# Patient Record
Sex: Female | Born: 1990 | Race: White | Hispanic: No | Smoking: Current every day smoker
Health system: Southern US, Community
[De-identification: ages and names within clinical notes are randomized; demographics above are authoritative.]

## PROBLEM LIST (undated history)

## (undated) DIAGNOSIS — N809 Endometriosis, unspecified: Secondary | ICD-10-CM

## (undated) DIAGNOSIS — N289 Disorder of kidney and ureter, unspecified: Secondary | ICD-10-CM

## (undated) DIAGNOSIS — N760 Acute vaginitis: Secondary | ICD-10-CM

## (undated) HISTORY — PX: ELBOW SURGERY: SHX618

## (undated) HISTORY — PX: BLADDER SURGERY: SHX569

## (undated) HISTORY — PX: BILATERAL SALPINGOOPHORECTOMY: SHX1223

## (undated) HISTORY — PX: TUMOR REMOVAL: SHX12

## (undated) HISTORY — PX: CHOLECYSTECTOMY: SHX55

## (undated) HISTORY — PX: ABDOMINAL HYSTERECTOMY: SHX81

---

## 2012-06-21 ENCOUNTER — Ambulatory Visit: Payer: Self-pay | Admitting: Obstetrics & Gynecology

## 2013-09-11 DIAGNOSIS — R102 Pelvic and perineal pain: Secondary | ICD-10-CM | POA: Insufficient documentation

## 2013-09-11 DIAGNOSIS — IMO0002 Reserved for concepts with insufficient information to code with codable children: Secondary | ICD-10-CM | POA: Insufficient documentation

## 2015-04-04 DIAGNOSIS — Y9389 Activity, other specified: Secondary | ICD-10-CM | POA: Insufficient documentation

## 2015-04-04 DIAGNOSIS — Y998 Other external cause status: Secondary | ICD-10-CM | POA: Insufficient documentation

## 2015-04-04 DIAGNOSIS — Z720 Tobacco use: Secondary | ICD-10-CM | POA: Insufficient documentation

## 2015-04-04 DIAGNOSIS — W2209XA Striking against other stationary object, initial encounter: Secondary | ICD-10-CM | POA: Insufficient documentation

## 2015-04-04 DIAGNOSIS — Y9289 Other specified places as the place of occurrence of the external cause: Secondary | ICD-10-CM | POA: Insufficient documentation

## 2015-04-04 DIAGNOSIS — S5002XA Contusion of left elbow, initial encounter: Secondary | ICD-10-CM | POA: Insufficient documentation

## 2015-04-04 NOTE — ED Notes (Signed)
Patient to ED for left elbow pain. States she had surgery last year and four days ago she bumped it on the wall a couple of times and also bumped it on the car door and the pain "has intensified so much that I can't even pick up my chihuahua or my cat, I can't carry a gallon of milk." Patient states pain starts at elbow and radiates upwards and downwards as well. States Aspirin, Aleve, ibuprofen, Ultram, Percocet and Tylenol are not helping.

## 2015-04-05 ENCOUNTER — Emergency Department: Payer: Self-pay

## 2015-04-05 ENCOUNTER — Emergency Department
Admission: EM | Admit: 2015-04-05 | Discharge: 2015-04-05 | Disposition: A | Discharge status: Home / Self Care | Payer: Self-pay | Attending: Emergency Medicine | Admitting: Emergency Medicine

## 2015-04-05 DIAGNOSIS — S5002XA Contusion of left elbow, initial encounter: Secondary | ICD-10-CM

## 2015-04-05 MED ORDER — TRAMADOL HCL 50 MG PO TABS
100.0000 mg | ORAL_TABLET | Freq: Once | ORAL | Status: AC
  Administered 2015-04-05: 100 mg via ORAL
  Filled 2015-04-05: qty 2

## 2015-04-05 MED ORDER — IBUPROFEN 600 MG PO TABS
600.0000 mg | ORAL_TABLET | Freq: Once | ORAL | Status: AC
  Administered 2015-04-05: 600 mg via ORAL
  Filled 2015-04-05: qty 1

## 2015-04-05 MED ORDER — TRAMADOL HCL 50 MG PO TABS: 50.0000 mg | ORAL_TABLET | Freq: Four times a day (QID) | ORAL | Status: DC | PRN

## 2015-04-05 MED ORDER — IBUPROFEN 600 MG PO TABS: 600.0000 mg | ORAL_TABLET | Freq: Three times a day (TID) | ORAL | Status: DC | PRN

## 2015-04-05 NOTE — ED Notes (Signed)
Pt requesting to have 4 rings removed from her hands d/t swelling of her fingers (2 rings on the first 2 fingers of each hand). With the electric ring-cutter, this RN removed the rings specified by the pt, placed them in a specimen cup and were given to the pt.

## 2015-04-05 NOTE — ED Provider Notes (Signed)
[facility] Emergency Department Ajay Strubel Note   ____________________________________________  Time seen: 6 exam I have reviewed the triage vital signs and the triage nursing note.  HISTORY  Chief Complaint Elbow Pain   Historian Patient and her mother  HPI Glenda Rodriguez is a 24 y.o. female who has a history of prior elbow fracture with repair, who bumped her elbow into a wall about 4 days ago and since then has had persistent pain which is not relieved by Tylenol and ibuprofen, heat or cold. Pain is located laterally on the olecranon bump. States at times the pain seems to radiate down to her left fourth and fifth finger and up into her arm.  There is no additional trauma.    History reviewed. No pertinent past medical history.  There are no active problems to display for this patient.   Past Surgical History  Procedure Laterality Date  . Elbow surgery    . Abdominal hysterectomy    . Cholecystectomy      Current Outpatient Rx  Name  Route  Sig  Dispense  Refill  . ibuprofen (ADVIL,MOTRIN) 600 MG tablet   Oral   Take 1 tablet (600 mg total) by mouth every 8 (eight) hours as needed.   20 tablet   0   . traMADol (ULTRAM) 50 MG tablet   Oral   Take 1 tablet (50 mg total) by mouth every 6 (six) hours as needed.   10 tablet   0     Allergies Morphine and related; Sulfa antibiotics; and Toradol  No family history on file.  Social History Social History  Substance Use Topics  . Smoking status: Current Every Day Smoker  . Smokeless tobacco: None  . Alcohol Use: No    Review of Systems  Constitutional: Negative for fever. Eyes: Negative for visual changes. ENT: Negative for sore throat. Cardiovascular: Negative for chest pain. Respiratory: Negative for shortness of breath. Gastrointestinal: Negative for abdominal pain, vomiting and diarrhea. Genitourinary: Negative for dysuria. Musculoskeletal: Negative for back pain. Skin:  Negative for rash. Neurological: Negative for headache. 10 point Review of Systems otherwise negative ____________________________________________   PHYSICAL EXAM:  VITAL SIGNS: ED Triage Vitals  Enc Vitals Group     BP 04/04/15 2347 130/80 mmHg     Pulse Rate 04/04/15 2347 97     Resp 04/04/15 2347 18     Temp 04/04/15 2347 98.2 F (36.8 C)     Temp Source 04/04/15 2347 Oral     SpO2 04/04/15 2347 96 %     Weight 04/04/15 2347 140 lb (63.504 kg)     Height 04/04/15 2347  (1.778 m)     Head Cir --      Peak Flow --      Pain Score 04/05/15 0000 10     Pain Loc --      Pain Edu? --      Excl. in GC? --      Constitutional: Alert and oriented. Well appearing and in no distress. Eyes: Conjunctivae are normal. PERRL. Normal extraocular movements. ENT   Head: Normocephalic and atraumatic.   Nose: No congestion/rhinnorhea.   Mouth/Throat: Mucous membranes are moist.   Neck: No stridor. Cardiovascular/Chest: Normal rate, regular rhythm.  No murmurs, rubs, or gallops. Respiratory: Normal respiratory effort without tachypnea nor retractions. Breath sounds are clear and equal bilaterally. No wheezes/rales/rhonchi. Gastrointestinal: Soft. No distention, no guarding, no rebound. Nontender   Genitourinary/rectal:Deferred Musculoskeletal:minimal swelling of the bursa at the  left elbow. No redness or skin rash. Some tenderness with passive range of motion. Neurovascularly intact. Neurologic:  Normal speech and language. No gross or focal neurologic deficits are appreciated. Skin:  Skin is warm, dry and intact. No rash noted. Psychiatric: Mood and affect are normal. Speech and behavior are normal. Patient exhibits appropriate insight and judgment.  ____________________________________________   EKG I, Governor Rooks, MD, the attending physician have personally viewed and interpreted all ECGs.  No EKG performed ____________________________________________  LABS  (pertinent positives/negatives)  none  ____________________________________________  RADIOLOGY All Xrays were viewed by me. Imaging interpreted by Radiologist.  Elbow complete:  IMPRESSION: Postoperative fixation of the olecranon. No acute displaced fractures identified in the left elbow. __________________________________________  PROCEDURES  Procedure(s) performed: None  Critical Care performed: None  ____________________________________________   ED COURSE / ASSESSMENT AND PLAN  CONSULTATIONS: None  Pertinent labs & imaging results that were available during my care of the patient were reviewed by me and considered in my medical decision making (see chart for details).  Patient awoke x-ray is without acute bony abnormality. I suspect her pain is accommodation of contusion to soft tissue, bone, and possibly ulnar nerve. I discussed with her I'm not going to prescribe Percocet for this, as she was asking for Percocet by name. Patient was instructed to continue taking anti-inflammatory ibuprofen, and I will give her prescription for a limited number of Ultram for additional pain control. She is referred to follow-up with orthopedics if not better after 1 week for consideration of additional imaging such as MRI or physical therapy.  Patient has rings that she's had trouble getting off due to high lateral hand swelling and is requesting [place] to cut the rings off. 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  Patient / Family / Caregiver informed of clinical course, medical decision-making process, and agree with plan.   I discussed return precautions, follow-up instructions, and discharged instructions with patient and/or family.  ___________________________________________   FINAL CLINICAL IMPRESSION(S) / ED DIAGNOSES   Final diagnoses:  Left elbow contusion, initial encounter       Governor Rooks, MD 04/05/15 [phone]

## 2015-04-05 NOTE — Discharge Instructions (Signed)
You were evaluated after bumping her elbow and your x-ray is reassuring. You're being treated with anti-inflammatory and pain medication for contusion/bruising.  Return to the emergency department for any worsening pain, numbness, weakness, skin rash or redness, or any other symptoms concerning to you.  We discussed follow-up with orthopedic surgeon, if you have persistent pain after one week.   Contusion A contusion is a deep bruise. Contusions happen when an injury causes bleeding under the skin. Signs of bruising include pain, puffiness (swelling), and discolored skin. The contusion may turn blue, purple, or yellow. HOME CARE   Put ice on the injured area.  Put ice in a plastic bag.  Place a towel between your skin and the bag.  Leave the ice on for 15-20 minutes, 03-04 times a day.  Only take medicine as told by your doctor.  Rest the injured area.  If possible, raise (elevate) the injured area to lessen puffiness. GET HELP RIGHT AWAY IF:   You have more bruising or puffiness.  You have pain that is getting worse.  Your puffiness or pain is not helped by medicine. MAKE SURE YOU:   Understand these instructions. Elbow Contusion  An elbow contusion is a deep bruise of the elbow. Contusions happen when an injury causes bleeding under the skin. Signs of bruising include pain, puffiness (swelling), and discolored skin. The contusion may turn blue, purple, or yellow. HOME CARE Put ice on the injured area. Put ice in a plastic bag. Place a towel between your skin and the bag. Leave the ice on for 15-20 minutes, 03-04 times a day. Only take medicines as told by your doctor. Rest your elbow until the pain and puffiness are better. Raise (elevate) your elbow to lessen puffiness. Put on an elastic wrap as told by your doctor. You can take it off for sleeping, showers, and baths. If your fingers get cold, blue, or lose feeling (numb), take the wrap off. Put the wrap back on more  loosely. Use your elbow only as told by your doctor. If you are asked to do elbow exercises, do them as told. Keep all doctor visits as told. GET HELP RIGHT AWAY IF: You have more redness, puffiness, or pain in your elbow. Your puffiness or pain is not helped by medicines. You have puffiness of the hand and fingers. You are not able to move your fingers or wrist. You start to lose feeling in your hand or fingers. Your fingers or hand become cold or blue. MAKE SURE YOU:  Understand these instructions. Will watch your condition. Will get help right away if you are not doing well or get worse. Document Released: 06/17/2011 Document Revised: 12/28/2011 Document Reviewed: 06/17/2011 [facility] Patient Information 2015 [place], [place]. This information is not intended to replace advice given to you by your health care provider. Make sure you discuss any questions you have with your health care provider.   Will watch your condition.  Will get help right away if you are not doing well or get worse. Document Released: 12/15/2007 Document Revised: 09/20/2011 Document Reviewed: 05/03/2011 [facility] Patient Information 2015 [place], [place]. This information is not intended to replace advice given to you by your health care provider. Make sure you discuss any questions you have with your health care provider.

## 2015-04-08 ENCOUNTER — Emergency Department
Admission: EM | Admit: 2015-04-08 | Discharge: 2015-04-08 | Disposition: A | Discharge status: Home / Self Care | Payer: Self-pay | Attending: Emergency Medicine | Admitting: Emergency Medicine

## 2015-04-08 ENCOUNTER — Encounter: Payer: Self-pay | Admitting: Emergency Medicine

## 2015-04-08 DIAGNOSIS — M2550 Pain in unspecified joint: Secondary | ICD-10-CM | POA: Insufficient documentation

## 2015-04-08 DIAGNOSIS — Z720 Tobacco use: Secondary | ICD-10-CM | POA: Insufficient documentation

## 2015-04-08 DIAGNOSIS — M25522 Pain in left elbow: Secondary | ICD-10-CM | POA: Insufficient documentation

## 2015-04-08 DIAGNOSIS — F329 Major depressive disorder, single episode, unspecified: Secondary | ICD-10-CM | POA: Insufficient documentation

## 2015-04-08 DIAGNOSIS — R339 Retention of urine, unspecified: Secondary | ICD-10-CM | POA: Insufficient documentation

## 2015-04-08 DIAGNOSIS — Z79899 Other long term (current) drug therapy: Secondary | ICD-10-CM | POA: Insufficient documentation

## 2015-04-08 DIAGNOSIS — F419 Anxiety disorder, unspecified: Secondary | ICD-10-CM | POA: Insufficient documentation

## 2015-04-08 LAB — GLUCOSE, CAPILLARY: GLUCOSE-CAPILLARY: 84 mg/dL (ref 65–99)

## 2015-04-08 LAB — SALICYLATE LEVEL

## 2015-04-08 LAB — URINALYSIS COMPLETE WITH MICROSCOPIC (ARMC ONLY)
BACTERIA UA: NONE SEEN
BILIRUBIN URINE: NEGATIVE
GLUCOSE, UA: NEGATIVE mg/dL
KETONES UR: NEGATIVE mg/dL
Leukocytes, UA: NEGATIVE
NITRITE: NEGATIVE
Protein, ur: NEGATIVE mg/dL
Specific Gravity, Urine: 1.012 (ref 1.005–1.030)
pH: 7 (ref 5.0–8.0)

## 2015-04-08 LAB — POCT PREGNANCY, URINE: PREG TEST UR: NEGATIVE

## 2015-04-08 LAB — URINE DRUG SCREEN, QUALITATIVE (ARMC ONLY)
Amphetamines, Ur Screen: NOT DETECTED
Barbiturates, Ur Screen: NOT DETECTED
Benzodiazepine, Ur Scrn: POSITIVE — AB
COCAINE METABOLITE, UR ~~LOC~~: NOT DETECTED
Cannabinoid 50 Ng, Ur ~~LOC~~: NOT DETECTED
MDMA (ECSTASY) UR SCREEN: NOT DETECTED
METHADONE SCREEN, URINE: NOT DETECTED
Opiate, Ur Screen: NOT DETECTED
Phencyclidine (PCP) Ur S: NOT DETECTED
Tricyclic, Ur Screen: NOT DETECTED

## 2015-04-08 LAB — CBC WITH DIFFERENTIAL/PLATELET
BASOS ABS: 0 10*3/uL (ref 0–0.1)
BASOS PCT: 0 %
Eosinophils Absolute: 0.2 10*3/uL (ref 0–0.7)
Eosinophils Relative: 3 %
HEMATOCRIT: 33.3 % — AB (ref 35.0–47.0)
HEMOGLOBIN: 11.2 g/dL — AB (ref 12.0–16.0)
Lymphocytes Relative: 15 %
Lymphs Abs: 1.3 10*3/uL (ref 1.0–3.6)
MCH: 27.7 pg (ref 26.0–34.0)
MCHC: 33.6 g/dL (ref 32.0–36.0)
MCV: 82.4 fL (ref 80.0–100.0)
Monocytes Absolute: 0.8 10*3/uL (ref 0.2–0.9)
Monocytes Relative: 10 %
NEUTROS ABS: 6.2 10*3/uL (ref 1.4–6.5)
NEUTROS PCT: 72 %
Platelets: 239 10*3/uL (ref 150–440)
RBC: 4.04 MIL/uL (ref 3.80–5.20)
RDW: 12.6 % (ref 11.5–14.5)
WBC: 8.5 10*3/uL (ref 3.6–11.0)

## 2015-04-08 LAB — BASIC METABOLIC PANEL
ANION GAP: 6 (ref 5–15)
BUN: 15 mg/dL (ref 6–20)
CHLORIDE: 98 mmol/L — AB (ref 101–111)
CO2: 28 mmol/L (ref 22–32)
Calcium: 8.7 mg/dL — ABNORMAL LOW (ref 8.9–10.3)
Creatinine, Ser: 0.59 mg/dL (ref 0.44–1.00)
GFR calc non Af Amer: >60 mL/min (ref >60)
Glucose, Bld: 90 mg/dL (ref 65–99)
Potassium: 4.1 mmol/L (ref 3.5–5.1)
Sodium: 132 mmol/L — ABNORMAL LOW (ref 135–145)

## 2015-04-08 LAB — ETHANOL: Alcohol, Ethyl (B): <5 mg/dL (ref <5)

## 2015-04-08 LAB — PREGNANCY, URINE: Preg Test, Ur: NEGATIVE

## 2015-04-08 LAB — ACETAMINOPHEN LEVEL

## 2015-04-08 LAB — CK: Total CK: 174 U/L (ref 38–234)

## 2015-04-08 MED ORDER — LORAZEPAM 1 MG PO TABS
1.0000 mg | ORAL_TABLET | Freq: Once | ORAL | Status: AC
  Administered 2015-04-08: 1 mg via ORAL
  Filled 2015-04-08: qty 1

## 2015-04-08 MED ORDER — CATHETER RED RUBBER COATED MISC: 1.0000 "application " | Freq: Two times a day (BID) | Status: DC

## 2015-04-08 NOTE — ED Notes (Signed)
Bladder scan results . Pt has hx of urinary retention. Reported to MD

## 2015-04-08 NOTE — ED Notes (Signed)
Pt to ed with c/o multiple complaints of pain over body,  Pt states pain in left elbow, bilat leg pain, back pain, bilat feet pain and also reports pain and difficulty with urination.

## 2015-04-08 NOTE — ED Notes (Signed)
Pt d/c'd with indwelling foley catheter converted to leg bag with follow up instructions.
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
## 2015-04-08 NOTE — Discharge Instructions (Signed)
You were seen in the ER today for urinary retention. This has been a problem for over a year, and you should follow up with urogynecology as previously directed by [facility]. If you are unable to see them soon, you should see a primary care doctor in the meantime. In the meantime, use catheters to drain your bladder intermittently as needed. Your blood tests and urine tests today did not show any other acute findings or complications.  Acute Urinary Retention Acute urinary retention is the temporary inability to urinate. This is an uncommon problem in women. It can be caused by:  Infection.  A side effect of a medicine.  A problem in a nearby organ that presses or squeezes on the bladder or the urethra (the tube that drains the bladder).  Psychological problems.   Surgery on your bladder, urethra, or pelvic organs that causes obstruction to the outflow of urine from your bladder. HOME CARE INSTRUCTIONS  If you are sent home with a Foley catheter and a drainage system, you will need to discuss the best course of action with your health care provider. While the catheter is in, maintain a good intake of fluids. Keep the drainage bag emptied and lower than your catheter. This is so that contaminated urine will not flow back into your bladder, which could lead to a urinary tract infection. There are two main types of drainage bags. One is a large bag that usually is used at night. It has a good capacity that will allow you to sleep through the night without having to empty it. The second type is called a leg bag. It has a smaller capacity so it needs to be emptied more frequently. However, the main advantage is that it can be attached by a leg strap and goes underneath your clothing, allowing you the freedom to move about or leave your home. Only take over-the-counter or prescription medicines for pain, discomfort, or fever as directed by your health care provider.  SEEK MEDICAL CARE IF:  You develop a  low-grade fever.  You experience spasms or leakage of urine with the spasms. SEEK IMMEDIATE MEDICAL CARE IF:  1. You develop chills or fever. 2. Your catheter stops draining urine. 3. Your catheter falls out. 4. You start to develop increased bleeding that does not respond to rest and increased fluid intake. MAKE SURE YOU:  Understand these instructions.  Will watch your condition.  Will get help right away if you are not doing well or get worse. Document Released: 06/27/2006 Document Revised: 04/18/2013 Document Reviewed: 12/07/2012 [facility] Patient Information 2015 [place], [place]. This information is not intended to replace advice given to you by your health care provider. Make sure you discuss any questions you have with your health care provider.  Clean Intermittent Catheterization Clean intermittent catheterization (CIC) refers to emptying urine from the bladder with a small, flexible tube (catheter). The bladder is an organ in the body that stores urine. Urine may need to be drained from your bladder with a catheter if:  There is an obstruction to the flow of urine out of the bladder or through the urinary tract.  The bladder muscles or nerves are not functioning properly to allow normal flow of urine out of the bladder. Emptying the bladder regularly will help prevent further permanent bladder or kidney damage. SUPPLIES FOR CIC You will need:  The specific type and size of catheter as directed by your caregiver.  Water-soluble, lubricating jelly (if the catheter is not pre-lubricated). Do  not use an oil-based lubricant.  A warm, soapy washcloth or pre-moistened wipes.  A container to collect the urine (if you are not using the toilet).  A container or bag to store the catheter. HOW TO PERFORM CIC Follow these steps for a clean technique: 5. Collect supplies and place them within your reach. 6. Wash your hands thoroughly with soap and water. 7. Get in a comfortable  position. Positions include:  Sitting forward-facing or backward-facing on a toilet, wheelchair, chair, or edge of bed. It may be helpful to sit forward-facing with a mirror on a stool positioned to help you view the opening of the urethra. Or sit backward-facing on a toilet with a mirror positioned between the toilet lid and toilet seat to help you view the opening of the urethra.  Standing beside the toilet with one foot on the toilet rim.  Lying down with your head raised on pillows. 8. Position the collection container between your legs (if you are not using the toilet). 9. Urinate (if you are able). 10. Place water-soluble, lubricating jelly on about 2 inches (5 cm) of the tip of the catheter (if the catheter is not pre-lubricated). 11. Set the catheter down on a clean, dry surface within reach. 12. Spread the labia folds open. 13. Wash the labia with the warm, soapy washcloth or pre-moistened wipes. Wash from front to back. 14. Hold the labia open with one hand. 15. Relax. 16. Insert the catheter gently into the urethra opening in an upward and backward direction until urine starts to flow, usually 2 to 3 inches (5 to 8 cm). 17. When urine starts to flow, insert the catheter about 1 inch (3 cm) more. 18. When the urine stops flowing, strain or gently push on the lower abdominal muscles to help the bladder drain fully. 19. Gently pull the catheter out. 20. Wash the labia and genital area. 21. Report any changes in the urine to your caregiver. 22. Discard the urine. 23. If you are using a multiple use catheter, wash the catheter as directed by your caregiver. Rinse. Allow to air dry. Store the catheter in a clean, dry container or bag. 24. Wash your hands. HOME CARE INSTRUCTIONS  Drink 6 to 8 glasses of fluid each day. Avoid caffeine. Caffeine may make you have to urinate more frequently and more urgently.  Empty your bladder every 4 to 6 hours or as directed by your  caregiver.  Perform a CIC if you have symptoms of too much urine in your bladder (overdistension), and you are not able to urinate. Symptoms of overdistension include:  Restlessness.  Sweating or chills.  Headache.  Flushed or pale color.  Cold limbs.  Bloated lower abdomen.  Discard a multiple use catheter when it becomes dry, brittle, or cloudy (usually after 1 week of use).  Take all medications as directed by your caregiver. SEEK MEDICAL CARE IF:  You are having trouble performing any of the steps.  You are leaking urine.  You have pain when you urinate.  You notice blood in your urine.  You feel the need to empty your bladder (void) often.  Your urine is cloudy or smells different.  You have pain in your abdomen.  You develop a rash or sores. SEEK IMMEDIATE MEDICAL CARE IF:  You have a fever or persistent symptoms for more than 72 hours.  You have a fever and your symptoms suddenly get worse.  Your pain becomes severe. Document Released: 07/31/2010 Document Revised: 11/12/2013 Document  Reviewed: 07/31/2010 [employer] Patient Information 2015 [place], [place]. This information is not intended to replace advice given to you by your health care provider. Make sure you discuss any questions you have with your health care provider.

## 2015-04-09 ENCOUNTER — Encounter: Payer: Self-pay | Admitting: Emergency Medicine

## 2015-04-09 ENCOUNTER — Emergency Department
Admission: EM | Admit: 2015-04-09 | Discharge: 2015-04-09 | Disposition: A | Discharge status: Home / Self Care | Payer: Self-pay | Attending: Emergency Medicine | Admitting: Emergency Medicine

## 2015-04-09 ENCOUNTER — Emergency Department: Payer: Self-pay

## 2015-04-09 DIAGNOSIS — G8929 Other chronic pain: Secondary | ICD-10-CM | POA: Insufficient documentation

## 2015-04-09 DIAGNOSIS — R1084 Generalized abdominal pain: Secondary | ICD-10-CM | POA: Insufficient documentation

## 2015-04-09 DIAGNOSIS — R1032 Left lower quadrant pain: Secondary | ICD-10-CM | POA: Insufficient documentation

## 2015-04-09 DIAGNOSIS — Z720 Tobacco use: Secondary | ICD-10-CM | POA: Insufficient documentation

## 2015-04-09 HISTORY — DX: Disorder of kidney and ureter, unspecified: N28.9

## 2015-04-09 LAB — URINALYSIS COMPLETE WITH MICROSCOPIC (ARMC ONLY)
BILIRUBIN URINE: NEGATIVE
Bacteria, UA: NONE SEEN
GLUCOSE, UA: NEGATIVE mg/dL
Ketones, ur: NEGATIVE mg/dL
Nitrite: NEGATIVE
Protein, ur: NEGATIVE mg/dL
SQUAMOUS EPITHELIAL / LPF: NONE SEEN
Specific Gravity, Urine: 1.035 — ABNORMAL HIGH (ref 1.005–1.030)
pH: 6 (ref 5.0–8.0)

## 2015-04-09 LAB — COMPREHENSIVE METABOLIC PANEL
ALK PHOS: 84 U/L (ref 38–126)
ALT: 22 U/L (ref 14–54)
AST: 27 U/L (ref 15–41)
Albumin: 3.6 g/dL (ref 3.5–5.0)
Anion gap: 5 (ref 5–15)
BUN: 16 mg/dL (ref 6–20)
CHLORIDE: 105 mmol/L (ref 101–111)
CO2: 30 mmol/L (ref 22–32)
CREATININE: 0.73 mg/dL (ref 0.44–1.00)
Calcium: 9 mg/dL (ref 8.9–10.3)
GFR calc Af Amer: >60 mL/min (ref >60)
Glucose, Bld: 80 mg/dL (ref 65–99)
Potassium: 4.2 mmol/L (ref 3.5–5.1)
Sodium: 140 mmol/L (ref 135–145)
Total Bilirubin: <0.1 mg/dL — ABNORMAL LOW (ref 0.3–1.2)
Total Protein: 6.6 g/dL (ref 6.5–8.1)

## 2015-04-09 LAB — CBC
HCT: 35.6 % (ref 35.0–47.0)
Hemoglobin: 11.9 g/dL — ABNORMAL LOW (ref 12.0–16.0)
MCH: 27.7 pg (ref 26.0–34.0)
MCHC: 33.4 g/dL (ref 32.0–36.0)
MCV: 83.1 fL (ref 80.0–100.0)
PLATELETS: 261 10*3/uL (ref 150–440)
RBC: 4.28 MIL/uL (ref 3.80–5.20)
RDW: 13 % (ref 11.5–14.5)
WBC: 7.8 10*3/uL (ref 3.6–11.0)

## 2015-04-09 LAB — LIPASE, BLOOD: Lipase: 19 U/L — ABNORMAL LOW (ref 22–51)

## 2015-04-09 LAB — CK: CK TOTAL: 129 U/L (ref 38–234)

## 2015-04-09 MED ORDER — ONDANSETRON HCL 4 MG/2ML IJ SOLN
4.0000 mg | Freq: Once | INTRAMUSCULAR | Status: AC
  Administered 2015-04-09: 4 mg via INTRAVENOUS
  Filled 2015-04-09: qty 2

## 2015-04-09 MED ORDER — IOHEXOL 240 MG/ML SOLN
25.0000 mL | Freq: Once | INTRAMUSCULAR | Status: AC | PRN
  Administered 2015-04-09: 25 mL via ORAL

## 2015-04-09 MED ORDER — OXYCODONE-ACETAMINOPHEN 5-325 MG PO TABS
1.0000 | ORAL_TABLET | Freq: Once | ORAL | Status: AC
  Administered 2015-04-09: 1 via ORAL
  Filled 2015-04-09: qty 1

## 2015-04-09 MED ORDER — FENTANYL CITRATE (PF) 100 MCG/2ML IJ SOLN
50.0000 ug | Freq: Once | INTRAMUSCULAR | Status: AC
  Administered 2015-04-09: 50 ug via INTRAVENOUS
  Filled 2015-04-09: qty 2

## 2015-04-09 MED ORDER — ONDANSETRON HCL 4 MG PO TABS: 4.0000 mg | ORAL_TABLET | Freq: Every day | ORAL | Status: DC | PRN

## 2015-04-09 MED ORDER — DICYCLOMINE HCL 20 MG PO TABS: 20.0000 mg | ORAL_TABLET | Freq: Three times a day (TID) | ORAL | Status: DC | PRN

## 2015-04-09 MED ORDER — OXYCODONE-ACETAMINOPHEN 5-325 MG PO TABS: 1.0000 | ORAL_TABLET | Freq: Four times a day (QID) | ORAL | Status: DC | PRN

## 2015-04-09 MED ORDER — SODIUM CHLORIDE 0.9 % IV BOLUS (SEPSIS)
1000.0000 mL | Freq: Once | INTRAVENOUS | Status: AC
  Administered 2015-04-09: 1000 mL via INTRAVENOUS

## 2015-04-09 MED ORDER — IOHEXOL 300 MG/ML  SOLN
100.0000 mL | Freq: Once | INTRAMUSCULAR | Status: AC | PRN
  Administered 2015-04-09: 100 mL via INTRAVENOUS

## 2015-04-09 NOTE — ED Notes (Signed)
Pt to ed with c/o abd pain,  Pt states she was seen here yesterday for inability to void.  Pt states she had catheter placed and this am she awoke and was unable to move.  Pt with blisters noted to left abd area.

## 2015-04-09 NOTE — ED Notes (Signed)
Patient transported to CT 

## 2015-04-09 NOTE — Discharge Instructions (Signed)

## 2015-04-09 NOTE — ED Provider Notes (Addendum)
[facility] Emergency Department Provider Note  ____________________________________________  Time seen: Approximately 945 AM  I have reviewed the triage vital signs and the nursing notes.   HISTORY  Chief Complaint Abdominal Pain    HPI Glenda Rodriguez is a 24 y.o. female with a history of an abdominal hysterectomy and cholecystectomy who is presenting today with diffuse abdominal pain which is worse in the right lower quadrant. She says that she is also had diffuse body aches. All the symptoms have been increasing over the last 3 days. She was seen in the emergency department here yesterday for urinary retention and required a Foley catheter. She has needed a Foley catheter in the past for urinary retention. She says her only outpatient medication at this time is tramadol. She is also complaining of a group of bolus to the left lower quadrant of her abdomen. She denies any recent antibiotics. Her last at about oh several months ago. The only medication she is taking at this time is been the tramadol. Also takes ibuprofen occasionally.The abdominal pain is sharp and diffuse. It is been constant. Says that she has a baseline vaginal discharge which is unchanged. Denies any bleeding. 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   Past Medical History  Diagnosis Date  . Renal disorder     There are no active problems to display for this patient.   Past Surgical History  Procedure Laterality Date  . Elbow surgery    . Abdominal hysterectomy    . Cholecystectomy      Current Outpatient Rx  Name  Route  Sig  Dispense  Refill  . ibuprofen (ADVIL,MOTRIN) 600 MG tablet   Oral   Take 1 tablet (600 mg total) by mouth every 8 (eight) hours as needed.   20 tablet   0   . traMADol (ULTRAM) 50 MG tablet   Oral   Take 1 tablet (50 mg total) by mouth every 6 (six) hours as needed.   10 tablet   0     Allergies Morphine and related; Sulfa antibiotics; and  Toradol  History reviewed. No pertinent family history.  Social History Social History  Substance Use Topics  . Smoking status: Current Every Day Smoker  . Smokeless tobacco: None  . Alcohol Use: No    Review of Systems Constitutional: No fever/chills Eyes: No visual changes. ENT: No sore throat. Cardiovascular: Denies chest pain. Respiratory: Denies shortness of breath. Gastrointestinal: As above  No nausea, no vomiting.  No diarrhea.  No constipation. Genitourinary: Negative for dysuria. Musculoskeletal: Negative for back pain. Skin: As above Neurological: Negative for headaches, focal weakness or numbness.  10-point ROS otherwise negative.  ____________________________________________   PHYSICAL EXAM:  VITAL SIGNS: ED Triage Vitals  Enc Vitals Group     BP 04/09/15 0927 115/71 mmHg     Pulse Rate 04/09/15 0927 87     Resp 04/09/15 0927 20     Temp 04/09/15 0927 97.9 F (36.6 C)     Temp Source 04/09/15 0927 Oral     SpO2 04/09/15 0927 96 %     Weight 04/09/15 0927 145 lb (65.772 kg)     Height 04/09/15 0927  (1.778 m)     Head Cir --      Peak Flow --      Pain Score 04/09/15 0927 9     Pain Loc --      Pain Edu? --      Excl.  in GC? --     Constitutional: Alert and oriented. in no acute distress. Eyes: Conjunctivae are normal. PERRL. EOMI. Head: Atraumatic. Nose: No congestion/rhinnorhea. Mouth/Throat: Mucous membranes are moist.  Oropharynx non-erythematous. Neck: No stridor.   Cardiovascular: Normal rate, regular rhythm. Grossly normal heart sounds.  Good peripheral circulation. Respiratory: Normal respiratory effort.  No retractions. Lungs CTAB. Gastrointestinal: Soft with tenderness diffusely with greater tenderness to the right lower quadrant. No rebound or guarding.. No distention. No abdominal bruits. No CVA tenderness. Musculoskeletal: No lower extremity tenderness nor edema.  No joint effusions. Neurologic:  Normal speech and language.  No gross focal neurologic deficits are appreciated. No gait instability. Skin:  Left lower quadrant with 3 cm x 6 cm area of several bullae. They're filled with serous fluid. There is mild tenderness over the area. There is a negative to occult ski sign. There are no intraoral lesions. There are no lesions to the palms or the soles.  Psychiatric: Mood and affect are normal. Speech and behavior are normal.  ____________________________________________   LABS (all labs ordered are listed, but only abnormal results are displayed)  Labs Reviewed  COMPREHENSIVE METABOLIC PANEL - Abnormal; Notable for the following:    Total Bilirubin <0.1 (*)    All other components within normal limits  CBC - Abnormal; Notable for the following:    Hemoglobin 11.9 (*)    All other components within normal limits  URINALYSIS COMPLETEWITH MICROSCOPIC ([facility] ONLY) - Abnormal; Notable for the following:    Color, Urine YELLOW (*)    APPearance CLEAR (*)    Specific Gravity, Urine 1.035 (*)    Hgb urine dipstick 1+ (*)    Leukocytes, UA TRACE (*)    All other components within normal limits  LIPASE, BLOOD - Abnormal; Notable for the following:    Lipase 19 (*)    All other components within normal limits  CK   ____________________________________________  EKG   ____________________________________________  RADIOLOGY  CAT scan of the abdomen without any acute findings. Foley catheter appears in good position. Post hysterectomy, segment backup location. Some cutaneous blisters in the left lower abdominal wall. ____________________________________________   PROCEDURES    ____________________________________________   INITIAL IMPRESSION / ASSESSMENT AND PLAN / ED COURSE  Pertinent labs & imaging results that were available during my care of the patient were reviewed by me and considered in my medical decision making (see chart for details). ----------------------------------------- 1:17 PM on  04/09/2015 -----------------------------------------  Patient resting comfortably at this time. Discussed lab as well as imaging findings with the patient as well as her mother. Discussed that there was no acute finding on the CAT scan that the labs are reassuring. The patient will follow-up with the mother's primary care doctor and I will give the patient the office number for urology on-call for office follow-up to remove her Foley. Patient has chronic issues with abdominal pain which was ultimately diagnosed as endometriosis which resulted in the removal of her uterus and ovaries. We'll discharge with Bentyl Zofran and several Percocet for breakthrough pain. We'll stop tramadol this may be the cause of her urinary retention. I checked the [place] is controlled substances reporting system which did not show any record of multiple narcotic prescriptions.  ____________________________________________   FINAL CLINICAL IMPRESSION(S) / ED DIAGNOSES  Acute on chronic abdominal pain. Return visit.    Myrna Blazer, MD 04/09/15 1319  Unclear cause of the bullae. However, did not have any mucosal involvement or lesions to the  palms or soles. Is not on any medication that is common for causing Stevens-Umbaugh's or toxic epidermal necrolysis. We discussed stopping tramadol for purposes of urinary retention, thus she will not be on any of her previous medications. She will do local wound care including an antibiotic ointment and keeping the lesions covered. Denied having any contact of anything abrasive to her skin in the left lower quadrant rather than where the blisters were found. No obvious medical contact for a contact dermatitis either. Discussed this with both the patient and her mother.  Myrna Blazer, MD 04/09/15 1400

## 2015-04-09 NOTE — ED Notes (Signed)
Report given to Derrick, RN.

## 2015-04-12 DIAGNOSIS — M549 Dorsalgia, unspecified: Secondary | ICD-10-CM | POA: Insufficient documentation

## 2015-04-12 DIAGNOSIS — F4310 Post-traumatic stress disorder, unspecified: Secondary | ICD-10-CM | POA: Insufficient documentation

## 2015-04-12 DIAGNOSIS — R339 Retention of urine, unspecified: Secondary | ICD-10-CM | POA: Insufficient documentation

## 2015-04-12 DIAGNOSIS — F419 Anxiety disorder, unspecified: Secondary | ICD-10-CM

## 2015-04-12 DIAGNOSIS — F32A Depression, unspecified: Secondary | ICD-10-CM | POA: Insufficient documentation

## 2015-04-12 DIAGNOSIS — F329 Major depressive disorder, single episode, unspecified: Secondary | ICD-10-CM | POA: Insufficient documentation

## 2015-04-12 DIAGNOSIS — G4730 Sleep apnea, unspecified: Secondary | ICD-10-CM | POA: Insufficient documentation

## 2015-04-12 DIAGNOSIS — T7491XA Unspecified adult maltreatment, confirmed, initial encounter: Secondary | ICD-10-CM | POA: Insufficient documentation

## 2015-04-12 DIAGNOSIS — R5383 Other fatigue: Secondary | ICD-10-CM | POA: Insufficient documentation

## 2015-04-13 DIAGNOSIS — R443 Hallucinations, unspecified: Secondary | ICD-10-CM | POA: Insufficient documentation

## 2015-04-14 DIAGNOSIS — F17200 Nicotine dependence, unspecified, uncomplicated: Secondary | ICD-10-CM | POA: Insufficient documentation

## 2015-04-24 ENCOUNTER — Encounter: Payer: Self-pay | Admitting: *Deleted

## 2015-04-24 ENCOUNTER — Ambulatory Visit: Payer: Self-pay | Admitting: Obstetrics and Gynecology

## 2015-04-24 ENCOUNTER — Encounter: Payer: Self-pay | Admitting: Obstetrics and Gynecology

## 2016-04-21 ENCOUNTER — Emergency Department: Payer: Self-pay

## 2016-04-21 ENCOUNTER — Emergency Department
Admission: EM | Admit: 2016-04-21 | Discharge: 2016-04-21 | Disposition: A | Discharge status: Home / Self Care | Payer: Self-pay | Attending: Emergency Medicine | Admitting: Emergency Medicine

## 2016-04-21 ENCOUNTER — Encounter: Payer: Self-pay | Admitting: Emergency Medicine

## 2016-04-21 DIAGNOSIS — Y9289 Other specified places as the place of occurrence of the external cause: Secondary | ICD-10-CM | POA: Insufficient documentation

## 2016-04-21 DIAGNOSIS — F17200 Nicotine dependence, unspecified, uncomplicated: Secondary | ICD-10-CM | POA: Insufficient documentation

## 2016-04-21 DIAGNOSIS — Y939 Activity, unspecified: Secondary | ICD-10-CM | POA: Insufficient documentation

## 2016-04-21 DIAGNOSIS — S93601A Unspecified sprain of right foot, initial encounter: Secondary | ICD-10-CM | POA: Insufficient documentation

## 2016-04-21 DIAGNOSIS — Y999 Unspecified external cause status: Secondary | ICD-10-CM | POA: Insufficient documentation

## 2016-04-21 DIAGNOSIS — X501XXA Overexertion from prolonged static or awkward postures, initial encounter: Secondary | ICD-10-CM | POA: Insufficient documentation

## 2016-04-21 MED ORDER — IBUPROFEN 600 MG PO TABS: 600.0000 mg | ORAL_TABLET | Freq: Three times a day (TID) | ORAL | 0 refills | Status: DC | PRN

## 2016-04-21 MED ORDER — IBUPROFEN 600 MG PO TABS
600.0000 mg | ORAL_TABLET | Freq: Once | ORAL | Status: AC
  Administered 2016-04-21: 600 mg via ORAL
  Filled 2016-04-21: qty 1

## 2016-04-21 MED ORDER — HYDROCODONE-ACETAMINOPHEN 5-325 MG PO TABS: 1.0000 | ORAL_TABLET | ORAL | 0 refills | Status: DC | PRN

## 2016-04-21 NOTE — Discharge Instructions (Signed)
Ice and elevate to reduce swelling which will also help with pain. Do not wrap Ace wrap tightly as this will cause increased swelling. Use wooden shoe for support. Use crutches when walking for support. Take ibuprofen and Norco as needed for pain. Follow-up with Dr. Ether GriffinsFowler if any continued problems with her foot.

## 2016-04-21 NOTE — ED Notes (Signed)
States she twisted her right ankle last pm  Bruising noted to lateral aspect on foot and top of foot near toes   Positive pulses

## 2016-04-21 NOTE — ED Triage Notes (Signed)
Pt to ed with c/o right ankle pain that started last night after she fell off her porch.  Pt with noted swelling and pain worse with ambulation.
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
## 2016-09-06 ENCOUNTER — Encounter (HOSPITAL_COMMUNITY): Payer: Self-pay

## 2016-09-06 ENCOUNTER — Inpatient Hospital Stay (HOSPITAL_COMMUNITY): Payer: Self-pay

## 2016-09-06 ENCOUNTER — Inpatient Hospital Stay (HOSPITAL_COMMUNITY)
Admission: AD | Admit: 2016-09-06 | Discharge: 2016-09-09 | DRG: 603 | Disposition: A | Discharge status: Home / Self Care | Payer: Self-pay | Source: Ambulatory Visit | Attending: Internal Medicine | Admitting: Internal Medicine

## 2016-09-06 DIAGNOSIS — R102 Pelvic and perineal pain: Secondary | ICD-10-CM | POA: Diagnosis present

## 2016-09-06 DIAGNOSIS — F329 Major depressive disorder, single episode, unspecified: Secondary | ICD-10-CM | POA: Diagnosis present

## 2016-09-06 DIAGNOSIS — N939 Abnormal uterine and vaginal bleeding, unspecified: Secondary | ICD-10-CM

## 2016-09-06 DIAGNOSIS — N760 Acute vaginitis: Secondary | ICD-10-CM

## 2016-09-06 DIAGNOSIS — Z886 Allergy status to analgesic agent status: Secondary | ICD-10-CM

## 2016-09-06 DIAGNOSIS — N930 Postcoital and contact bleeding: Secondary | ICD-10-CM | POA: Diagnosis present

## 2016-09-06 DIAGNOSIS — Z79890 Hormone replacement therapy (postmenopausal): Secondary | ICD-10-CM

## 2016-09-06 DIAGNOSIS — Z90710 Acquired absence of both cervix and uterus: Secondary | ICD-10-CM

## 2016-09-06 DIAGNOSIS — L03818 Cellulitis of other sites: Principal | ICD-10-CM | POA: Diagnosis present

## 2016-09-06 DIAGNOSIS — G8929 Other chronic pain: Secondary | ICD-10-CM | POA: Diagnosis present

## 2016-09-06 DIAGNOSIS — R109 Unspecified abdominal pain: Secondary | ICD-10-CM

## 2016-09-06 DIAGNOSIS — F17210 Nicotine dependence, cigarettes, uncomplicated: Secondary | ICD-10-CM | POA: Diagnosis present

## 2016-09-06 DIAGNOSIS — F419 Anxiety disorder, unspecified: Secondary | ICD-10-CM

## 2016-09-06 DIAGNOSIS — K5792 Diverticulitis of intestine, part unspecified, without perforation or abscess without bleeding: Secondary | ICD-10-CM | POA: Insufficient documentation

## 2016-09-06 DIAGNOSIS — F418 Other specified anxiety disorders: Secondary | ICD-10-CM | POA: Diagnosis present

## 2016-09-06 HISTORY — DX: Endometriosis, unspecified: N80.9

## 2016-09-06 HISTORY — DX: Acute vaginitis: N76.0

## 2016-09-06 LAB — COMPREHENSIVE METABOLIC PANEL
ALK PHOS: 61 U/L (ref 38–126)
ALT: 12 U/L — AB (ref 14–54)
ANION GAP: 9 (ref 5–15)
AST: 16 U/L (ref 15–41)
Albumin: 4.1 g/dL (ref 3.5–5.0)
BUN: 11 mg/dL (ref 6–20)
CALCIUM: 8.9 mg/dL (ref 8.9–10.3)
CO2: 23 mmol/L (ref 22–32)
CREATININE: 0.58 mg/dL (ref 0.44–1.00)
Chloride: 104 mmol/L (ref 101–111)
Glucose, Bld: 98 mg/dL (ref 65–99)
Potassium: 3.9 mmol/L (ref 3.5–5.1)
SODIUM: 136 mmol/L (ref 135–145)
Total Bilirubin: 0.5 mg/dL (ref 0.3–1.2)
Total Protein: 7.9 g/dL (ref 6.5–8.1)

## 2016-09-06 LAB — CBC WITH DIFFERENTIAL/PLATELET
Basophils Absolute: 0 10*3/uL (ref 0.0–0.1)
Basophils Relative: 0 %
EOS ABS: 0 10*3/uL (ref 0.0–0.7)
EOS PCT: 0 %
HCT: 37.9 % (ref 36.0–46.0)
HEMOGLOBIN: 13.3 g/dL (ref 12.0–15.0)
LYMPHS PCT: 11 %
Lymphs Abs: 1.4 10*3/uL (ref 0.7–4.0)
MCH: 29.4 pg (ref 26.0–34.0)
MCHC: 35.1 g/dL (ref 30.0–36.0)
MCV: 83.7 fL (ref 78.0–100.0)
MONOS PCT: 4 %
Monocytes Absolute: 0.5 10*3/uL (ref 0.1–1.0)
Neutro Abs: 10.9 10*3/uL — ABNORMAL HIGH (ref 1.7–7.7)
Neutrophils Relative %: 85 %
PLATELETS: 216 10*3/uL (ref 150–400)
RBC: 4.53 MIL/uL (ref 3.87–5.11)
RDW: 13.1 % (ref 11.5–15.5)
WBC: 12.8 10*3/uL — ABNORMAL HIGH (ref 4.0–10.5)

## 2016-09-06 LAB — URINALYSIS, ROUTINE W REFLEX MICROSCOPIC
BILIRUBIN URINE: NEGATIVE
Bacteria, UA: NONE SEEN
GLUCOSE, UA: NEGATIVE mg/dL
Ketones, ur: NEGATIVE mg/dL
LEUKOCYTES UA: NEGATIVE
NITRITE: NEGATIVE
PH: 5 (ref 5.0–8.0)
Protein, ur: NEGATIVE mg/dL
SPECIFIC GRAVITY, URINE: 1.021 (ref 1.005–1.030)

## 2016-09-06 LAB — RAPID URINE DRUG SCREEN, HOSP PERFORMED
AMPHETAMINES: NOT DETECTED
BARBITURATES: NOT DETECTED
Benzodiazepines: POSITIVE — AB
Cocaine: NOT DETECTED
Opiates: NOT DETECTED
TETRAHYDROCANNABINOL: POSITIVE — AB

## 2016-09-06 LAB — SEDIMENTATION RATE: SED RATE: 7 mm/h (ref 0–22)

## 2016-09-06 LAB — C-REACTIVE PROTEIN: CRP: 7.4 mg/dL — ABNORMAL HIGH (ref <1.0)

## 2016-09-06 MED ORDER — ONDANSETRON HCL 4 MG/2ML IJ SOLN
4.0000 mg | Freq: Four times a day (QID) | INTRAMUSCULAR | Status: AC | PRN
  Administered 2016-09-06: 4 mg via INTRAVENOUS
  Filled 2016-09-06: qty 2

## 2016-09-06 MED ORDER — CIPROFLOXACIN IN D5W 400 MG/200ML IV SOLN
400.0000 mg | Freq: Once | INTRAVENOUS | Status: AC
  Administered 2016-09-06: 400 mg via INTRAVENOUS
  Filled 2016-09-06: qty 200

## 2016-09-06 MED ORDER — HYDROCODONE-ACETAMINOPHEN 5-325 MG PO TABS
1.0000 | ORAL_TABLET | ORAL | Status: DC | PRN
Start: 2016-09-06 — End: 2016-09-06
  Administered 2016-09-06: 2 via ORAL
  Filled 2016-09-06: qty 2

## 2016-09-06 MED ORDER — FENTANYL CITRATE (PF) 100 MCG/2ML IJ SOLN
50.0000 ug | Freq: Once | INTRAMUSCULAR | Status: AC
  Administered 2016-09-06: 50 ug via INTRAVENOUS
  Filled 2016-09-06: qty 2

## 2016-09-06 MED ORDER — HYDROMORPHONE HCL 1 MG/ML IJ SOLN
0.5000 mg | Freq: Once | INTRAMUSCULAR | Status: AC
  Administered 2016-09-06: 0.5 mg via INTRAVENOUS
  Filled 2016-09-06: qty 1

## 2016-09-06 MED ORDER — ESTRADIOL 1 MG PO TABS: 1.5000 mg | ORAL_TABLET | Freq: Every day | ORAL | Status: DC

## 2016-09-06 MED ORDER — INFLUENZA VAC SPLIT QUAD 0.5 ML IM SUSY: 0.5000 mL | PREFILLED_SYRINGE | INTRAMUSCULAR | Status: DC | PRN

## 2016-09-06 MED ORDER — PROMETHAZINE HCL 25 MG PO TABS
25.0000 mg | ORAL_TABLET | Freq: Four times a day (QID) | ORAL | Status: DC | PRN
  Administered 2016-09-06 – 2016-09-08 (×7): 25 mg via ORAL
  Filled 2016-09-06 (×7): qty 1

## 2016-09-06 MED ORDER — ESTRADIOL 1 MG PO TABS
1.5000 mg | ORAL_TABLET | Freq: Every day | ORAL | Status: DC
  Administered 2016-09-06 – 2016-09-09 (×4): 1.5 mg via ORAL
  Filled 2016-09-06 (×4): qty 1.5

## 2016-09-06 MED ORDER — ONDANSETRON HCL 4 MG/2ML IJ SOLN
4.0000 mg | Freq: Once | INTRAMUSCULAR | Status: AC
  Administered 2016-09-06: 4 mg via INTRAVENOUS
  Filled 2016-09-06: qty 2

## 2016-09-06 MED ORDER — ENSURE ENLIVE PO LIQD: 237.0000 mL | Freq: Two times a day (BID) | ORAL | Status: DC

## 2016-09-06 MED ORDER — IOPAMIDOL (ISOVUE-300) INJECTION 61%
100.0000 mL | Freq: Once | INTRAVENOUS | Status: AC | PRN
  Administered 2016-09-06: 100 mL via INTRAVENOUS

## 2016-09-06 MED ORDER — ENOXAPARIN SODIUM 40 MG/0.4ML ~~LOC~~ SOLN
40.0000 mg | Freq: Every day | SUBCUTANEOUS | Status: DC
  Administered 2016-09-06 – 2016-09-08 (×3): 40 mg via SUBCUTANEOUS
  Filled 2016-09-06 (×3): qty 0.4

## 2016-09-06 MED ORDER — OXYCODONE-ACETAMINOPHEN 5-325 MG PO TABS
1.0000 | ORAL_TABLET | ORAL | Status: DC | PRN
  Administered 2016-09-06 – 2016-09-07 (×4): 2 via ORAL
  Filled 2016-09-06 (×5): qty 2

## 2016-09-06 MED ORDER — DEXTROSE 5 % IV SOLN
100.0000 mg | Freq: Two times a day (BID) | INTRAVENOUS | Status: DC
  Filled 2016-09-06 (×2): qty 100

## 2016-09-06 MED ORDER — LEVOFLOXACIN IN D5W 500 MG/100ML IV SOLN
500.0000 mg | INTRAVENOUS | Status: DC
  Filled 2016-09-06: qty 100

## 2016-09-06 MED ORDER — METRONIDAZOLE 500 MG PO TABS
500.0000 mg | ORAL_TABLET | Freq: Three times a day (TID) | ORAL | Status: DC
  Administered 2016-09-07: 500 mg via ORAL
  Filled 2016-09-06: qty 1

## 2016-09-06 MED ORDER — SODIUM CHLORIDE 0.9 % IV SOLN
INTRAVENOUS | Status: AC
  Administered 2016-09-06 – 2016-09-07 (×3): via INTRAVENOUS

## 2016-09-06 MED ORDER — NICOTINE 21 MG/24HR TD PT24
21.0000 mg | MEDICATED_PATCH | Freq: Once | TRANSDERMAL | Status: AC
  Administered 2016-09-06: 21 mg via TRANSDERMAL
  Filled 2016-09-06: qty 1

## 2016-09-06 MED ORDER — TRAZODONE HCL 100 MG PO TABS
100.0000 mg | ORAL_TABLET | Freq: Every day | ORAL | Status: DC
  Administered 2016-09-06 – 2016-09-08 (×3): 100 mg via ORAL
  Filled 2016-09-06 (×3): qty 1

## 2016-09-06 MED ORDER — PNEUMOCOCCAL VAC POLYVALENT 25 MCG/0.5ML IJ INJ
0.5000 mL | INJECTION | INTRAMUSCULAR | Status: DC | PRN
Start: 2016-09-08 — End: 2016-09-09

## 2016-09-06 MED ORDER — LEVOFLOXACIN IN D5W 500 MG/100ML IV SOLN
500.0000 mg | INTRAVENOUS | Status: DC
  Administered 2016-09-07: 500 mg via INTRAVENOUS
  Filled 2016-09-06: qty 100

## 2016-09-06 MED ORDER — DEXTROSE 5 % IV SOLN: 1.0000 g | INTRAVENOUS | Status: DC

## 2016-09-06 MED ORDER — METRONIDAZOLE IN NACL 5-0.79 MG/ML-% IV SOLN
500.0000 mg | Freq: Once | INTRAVENOUS | Status: AC
  Administered 2016-09-06: 500 mg via INTRAVENOUS
  Filled 2016-09-06: qty 100

## 2016-09-06 MED ORDER — OXYCODONE-ACETAMINOPHEN 5-325 MG PO TABS
2.0000 | ORAL_TABLET | Freq: Once | ORAL | Status: AC
  Administered 2016-09-06: 2 via ORAL
  Filled 2016-09-06: qty 2

## 2016-09-06 MED ORDER — DIPHENHYDRAMINE HCL 25 MG PO CAPS
25.0000 mg | ORAL_CAPSULE | ORAL | Status: DC | PRN
  Administered 2016-09-06 – 2016-09-09 (×8): 25 mg via ORAL
  Filled 2016-09-06 (×8): qty 1

## 2016-09-06 MED ORDER — IOPAMIDOL (ISOVUE-300) INJECTION 61%
30.0000 mL | Freq: Once | INTRAVENOUS | Status: AC | PRN
  Administered 2016-09-06: 30 mL via ORAL

## 2016-09-06 MED ORDER — ACETAMINOPHEN 325 MG PO TABS: 650.0000 mg | ORAL_TABLET | Freq: Four times a day (QID) | ORAL | Status: DC | PRN

## 2016-09-06 NOTE — ED Notes (Addendum)
Pt requesting pain medication and phenergan, will make MD aware

## 2016-09-06 NOTE — Progress Notes (Signed)
Pt received 2 tabs Norco and 4 mg Zofran at 2000 and still complained of nausea and pain score of 9/10 over an hour later.  On-call hospitalist was informed and ordered Phenergan 25 mg by mouth every 6 hours as needed for nausea and Percocet 1-2 tabs by mouth every 4 hours for moderate and/or severe pain. Both new meds were given at 22:20. States nausea is now gone. Still has pain score of 9 but is resting in bed. Will continue to monitor pt.  Harriet Massonavidson, Laconda Basich E, RN

## 2016-09-06 NOTE — ED Notes (Signed)
Pt reporting nausea, but [facility]. Encouraged her to refrain from greasy, fried foods if her stomach has been feeling upset.

## 2016-09-06 NOTE — H&P (Addendum)
History and Physical    [name] WUJ:[id number] DOB: 01-31-1991 DOA: 09/06/2016  PCP: No PCP Per Patient   Patient coming from: Home, by way of [facility]   Chief Complaint: Pelvic pain and vaginal bleeding  HPI: Glenda Rodriguez is a 26 y.o. female with medical history significant for endometriosis status post hysterectomy and bilateral oophorectomy, and depression with anxiety who presents from the [facility] where she was seen for one of pelvic pain and vaginal bleeding. She reports pain in her usual state until late last night when she developed pelvic pain and vaginal bleeding with intercourse. Pain and bleeding continued afterwards and the patient was evaluated at the [facility] earlier today. She could not tolerate a speculum exam due to pain, but a bimanual was performed with tenderness at the introitus and only scant blood. CT scan of the abdomen and pelvis was performed there with moderate pelvic edema and trace extraluminal fluid and gas concerning for possible PID versus diverticulitis. Gynecology felt PID to be unlikely in a patient status post hysterectomy. Patient has not had significant abdominal pain or change in stool. She has noted subjective fevers and chills a couple nights ago. She was sent to [facility] ED for further evaluation.  ED Course: Upon arrival to the ED, patient is found to be afebrile, saturating well on room air, transiently tachycardic, and with vitals otherwise stable. Chemistry panel is unremarkable and CBC is notable for a leukocytosis to 12,800. UDS is positive for benzodiazepines and THC and urinalysis features small hemoglobin only. Gen. surgery was consulted by the ED physician for possible diverticulitis with perforation, no surgery is more concerned for a vaginal cuff tear. Medical admission for IV antibiotics and serial exams was advised. Patient was treated with empiric Flagyl and ciprofloxacin. She was given 2 doses of IV Dilaudid  and 10 mg Percocet. She requested Phenergan and Benadryl specifically to be given IV. She remained hemodynamically stable and in no apparent distress in the ED and will be admitted to the medical surgical unit for ongoing evaluation and management of pelvic pain with vaginal bleeding concerning for possible vaginal cuff tear, versus PID or diverticulitis.  Review of Systems:  All other systems reviewed and apart from HPI, are negative.  Past Medical History:  Diagnosis Date  . Endometriosis   . Renal disorder     Past Surgical History:  Procedure Laterality Date  . ABDOMINAL HYSTERECTOMY    . BLADDER SURGERY    . CHOLECYSTECTOMY    . ELBOW SURGERY    . TUMOR REMOVAL       reports that she has been smoking.  She has been smoking about 1.00 pack per day. She has never used smokeless tobacco. She reports that she does not drink alcohol or use drugs.  Allergies  Allergen Reactions  . Morphine And Related     Burning, stabbing pain in back  . Sulfa Antibiotics     Bruising  . Toradol [Ketorolac Tromethamine]     Chest pain    History reviewed. No pertinent family history.   Prior to Admission medications   Medication Sig Start Date End Date Taking? Authorizing Provider  estradiol (ESTRACE) 1 MG tablet Take 1.5 mg by mouth daily.   Yes Historical Provider, MD  ibuprofen (ADVIL,MOTRIN) 200 MG tablet Take 800 mg by mouth every 6 (six) hours as needed for moderate pain or cramping.   Yes Historical Provider, MD  traZODone (DESYREL) 100 MG tablet Take 100 mg by mouth  at bedtime.   Yes Historical Provider, MD    Physical Exam: Vitals:   09/06/16 1930 09/06/16 1945 09/06/16 2015 09/06/16 2043  BP: 112/56 116/77 101/78 109/70  Pulse: 88 71 83 81  Resp:    18  Temp:    98.4 F (36.9 C)  TempSrc:    Oral  SpO2: 98% 97% 96% 98%      Constitutional: NAD, calm, comfortable Eyes: PERTLA, lids and conjunctivae normal ENMT: Mucous membranes are moist. Posterior pharynx clear of  any exudate or lesions.   Neck: normal, supple, no masses, no thyromegaly Respiratory: clear to auscultation bilaterally, no wheezing, no crackles. Normal respiratory effort.   Cardiovascular: S1 & S2 heard, regular rate and rhythm. No extremity edema. No significant JVD. Abdomen: No distension, soft, tender in lower quadrants, Rt > L without rebound pain or guarding. No masses palpated. Bowel sounds normal.  Musculoskeletal: no clubbing / cyanosis. No joint deformity upper and lower extremities. Normal muscle tone.  Skin: no significant rashes, lesions, ulcers. Warm, dry, well-perfused. Neurologic: CN 2-12 grossly intact. Sensation intact, DTR normal. Strength 5/5 in all 4 limbs.  Psychiatric: Normal judgment and insight. Alert and oriented x 3. Normal mood and affect.     Labs on Admission: I have personally reviewed following labs and imaging studies  CBC:  Recent Labs Lab 09/06/16 1200  WBC 12.8*  NEUTROABS 10.9*  HGB 13.3  HCT 37.9  MCV 83.7  PLT 216   Basic Metabolic Panel:  Recent Labs Lab 09/06/16 1200  NA 136  K 3.9  CL 104  CO2 23  GLUCOSE 98  BUN 11  CREATININE 0.58  CALCIUM 8.9   GFR: CrCl cannot be calculated (Unknown ideal weight.). Liver Function Tests:  Recent Labs Lab 09/06/16 1200  AST 16  ALT 12*  ALKPHOS 61  BILITOT 0.5  PROT 7.9  ALBUMIN 4.1   No results for input(s): LIPASE, AMYLASE in the last 168 hours. No results for input(s): AMMONIA in the last 168 hours. Coagulation Profile: No results for input(s): INR, PROTIME in the last 168 hours. Cardiac Enzymes: No results for input(s): CKTOTAL, CKMB, CKMBINDEX, TROPONINI in the last 168 hours. BNP (last 3 results) No results for input(s): PROBNP in the last 8760 hours. HbA1C: No results for input(s): HGBA1C in the last 72 hours. CBG: No results for input(s): GLUCAP in the last 168 hours. Lipid Profile: No results for input(s): CHOL, HDL, LDLCALC, TRIG, CHOLHDL, LDLDIRECT in the last  72 hours. Thyroid Function Tests: No results for input(s): TSH, T4TOTAL, FREET4, T3FREE, THYROIDAB in the last 72 hours. Anemia Panel: No results for input(s): VITAMINB12, FOLATE, FERRITIN, TIBC, IRON, RETICCTPCT in the last 72 hours. Urine analysis:    Component Value Date/Time   COLORURINE YELLOW 09/06/2016 1136   APPEARANCEUR CLEAR 09/06/2016 1136   LABSPEC 1.021 09/06/2016 1136   PHURINE 5.0 09/06/2016 1136   GLUCOSEU NEGATIVE 09/06/2016 1136   HGBUR SMALL (A) 09/06/2016 1136   BILIRUBINUR NEGATIVE 09/06/2016 1136   KETONESUR NEGATIVE 09/06/2016 1136   PROTEINUR NEGATIVE 09/06/2016 1136   NITRITE NEGATIVE 09/06/2016 1136   LEUKOCYTESUR NEGATIVE 09/06/2016 1136   Sepsis Labs: @LABRCNTIP (procalcitonin:4,lacticidven:4) )No results found for this or any previous visit (from the past 240 hour(s)).   Radiological Exams on Admission: Ct Abdomen Pelvis W Contrast  Result Date: 09/06/2016 CLINICAL DATA:  Pelvic pain radiating to back since last night. Onset of pain with intercourse. History of endometriosis. EXAM: CT ABDOMEN AND PELVIS WITH CONTRAST TECHNIQUE: Multidetector CT imaging  of the abdomen and pelvis was performed using the standard protocol following bolus administration of intravenous contrast. CONTRAST:  100mL ISOVUE-300 IOPAMIDOL (ISOVUE-300) INJECTION 61% COMPARISON:  04/09/2015 FINDINGS: Lower chest: Clear lung bases. Normal heart size without pericardial or pleural effusion. Hepatobiliary: Possible too small to characterize left hepatic lobe lesion on image 22/ series 2. Cholecystectomy, without biliary ductal dilatation. Pancreas: Normal, without mass or ductal dilatation. Spleen: Normal in size, without focal abnormality. Adrenals/Urinary Tract: Normal adrenal glands. Interpolar too small to characterize right renal lesion. Normal left kidney, without hydronephrosis or hydroureter. Normal urinary bladder. Stomach/Bowel: Normal stomach, without wall thickening. Normal caliber  of the colon. Proximal appendix normal on image 35 coronal. Difficult to follow distally. Normal terminal ileum. Normal small bowel. Vascular/Lymphatic: Normal caliber of the aorta and branch vessels. No abdominopelvic adenopathy. Reproductive: Hysterectomy.  No well-defined adnexal mass. Other: Moderate edema throughout the pelvis. Suspicion of minimal all extraluminal air and fluid within the central pelvis including on images 68 and 69/series 2. Example fluid measuring 1.7 cm. Musculoskeletal: No acute osseous abnormality. IMPRESSION: Moderate pelvic edema with suspicion of trace extraluminal fluid and gas. Considerations include pelvic inflammatory disease and diverticulitis. Consider correlation with pelvic ultrasound. Short-term pelvic CT follow-up may be informative to exclude developing abscess. These results will be called to the ordering clinician or representative by the Radiologist Assistant, and communication documented in the PACS or zVision Dashboard. Electronically Signed   By: Jeronimo GreavesKyle  Talbot M.D.   On: 09/06/2016 15:12    EKG: Not performed, will obtain as appropriate.   Assessment/Plan  1. Pelvic pain and vaginal bleeding - Began early am of 08/27/16 during intercourse and has persisted - [facility] with scant blood and tenderness at introitus  - CT with moderate pelvic edema and concern for trace extraluminal fluid and gas  - Gyn felt PID to be unlikely in pt without uterus; diverticulitis possible; gen surg concerned for possible cuff tear and has advised empiric abx, noting that ex-lap may be necessary if she fails to improve with conservative management  - Continue Levaquin and Flagyl, follow clinical response to treatment; supportive care with analgesia    2. Depression with anxiety - Pt managed with trazodone only, will continue qHS  - UDS positive for benzodiazepines, not on medication list - Appears to be stable    DVT prophylaxis: sq Lovenox  Code  Status: Full  Family Communication: Mother updated at bedside with patient's permission Disposition Plan: Admit to med-surg Consults called: Gen surgery Admission status: Inpatient    Briscoe Deutscherimothy S Hannan Tetzlaff, MD [facility] Pager [phone]  If 7PM-7AM, please contact night-coverage [email or web address] Password [email or web address]  09/06/2016, 8:48 PM

## 2016-09-06 NOTE — MAU Note (Addendum)
Had surgery last Aug, to have a tumor removed.  Since then has had problems with pain- brought on by intercourse, vag bleeding, abd swelling..  Hurts to sit, stand or walk. Getting worse. Feels nauseated and weak, like she is going to pass out.

## 2016-09-06 NOTE — ED Notes (Addendum)
Cipro IVPB infusion found clamped with only NS running. Unclamped abx and reset pump.

## 2016-09-06 NOTE — ED Notes (Signed)
Pt called out requesting nicotine patch.  Will make MD aware.

## 2016-09-06 NOTE — Consult Note (Signed)
Surgical Consultation Requesting provider: Dr. Rosalia Hammersay  CC: pelvic pain, vaginal bleeding  HPI: This is a 25yo woman with a history of chronic abdominal pain secondary to endometriosis and s/p multiple gynecological surgeries (hysterectomy and unilateral oophorectomy at age 26 for endometriosis; then in August  a ?myomectomy [maybe remnant?? She states a tumor had formed where her uterus was and thinks it was a fibroid]  removal of the other ovary complicated by bladder problems requiring re-exploration) who developed acute pelvic pain around 2am last night. This began after intercourse, during and after which she reports vaginal bleeding. It has continued to worsen and she describes it as sharp and crampy pain. She is beginning to have some nausea associated with the pain, which she feels is more severe on the right side. She has had pain like this before but not to this severity and usually associated with intercourse. Denies fevers, vomiting, melena, constipation diarrhea. Lat bowel movement yesterday normal, no flatus since she began having this pain. [facility]. She was unable to tolerate a speculum exam due to pain; she did have a bimanual exam which noted significant tenderness at the introitus with scant blood, no masses bilaterally or signs of active bleeding.  She was sent for a CT scan which shows pelvic inflammation, see report below.  General surgery consulted for possible diverticulitis within the differential.   Allergies  Allergen Reactions  . Morphine And Related     Burning, stabbing pain in back  . Sulfa Antibiotics     Bruising  . Toradol [Ketorolac Tromethamine]     Chest pain    Past Medical History:  Diagnosis Date  . Endometriosis   . Renal disorder     Past Surgical History:  Procedure Laterality Date  . ABDOMINAL HYSTERECTOMY    . BLADDER SURGERY    . CHOLECYSTECTOMY    . ELBOW SURGERY    . TUMOR REMOVAL      No family history on  file.  Social History   Social History  . Marital status: Married    Spouse name: N/A  . Number of children: N/A  . Years of education: N/A   Social History Main Topics  . Smoking status: Current Every Day Smoker    Packs/day: 1.00  . Smokeless tobacco: Never Used  . Alcohol use No  . Drug use: No  . Sexual activity: Yes   Other Topics Concern  . None   Social History Narrative  . None    No current facility-administered medications on file prior to encounter.    No current outpatient prescriptions on file prior to encounter.    Review of Systems: a complete, 10pt review of systems was completed with pertinent positives and negatives as documented in the HPI.   Physical Exam: Vitals:   09/06/16 1451 09/06/16 1625  BP: 114/66 120/69  Pulse: 86 94  Resp: 18 18  Temp: 98.1 F (36.7 C) 98.1 F (36.7 C)   Gen: A&Ox3, no distress. Seems generally uncomfortable but moves around on the stretcher without limitation when distracted. Head: normocephalic, atraumatic, EOMI, anicteric.  Neck: supple without mass or thyromegaly Chest: unlabored respirations, symmetrical air entry  Cardiovascular: RRR with palpable distal pulses Abdomen: Soft, nondistended, tender in bilateral lower quadrants with voluntary guarding. Minimal upper abdominal tenderness. No hernia or mass, no palpable organomegaly Extremities: warm, without edema, no deformities  Neuro: grossly intact Psych: appropriate mood and affect, appropriate insight Skin: no lesions or rashes on limited  skin exam   CBC Latest Ref Rng & Units 09/06/2016 04/09/2015 04/08/2015  WBC 4.0 - 10.5 K/uL 12.8(H) 7.8 8.5  Hemoglobin 12.0 - 15.0 g/dL 95.6 11.9(L) 11.2(L)  Hematocrit 36.0 - 46.0 % 37.9 35.6 33.3(L)  Platelets 150 - 400 K/uL 216 261 239    CMP Latest Ref Rng & Units 09/06/2016 04/09/2015 04/08/2015  Glucose 65 - 99 mg/dL 98 80 90  BUN 6 - 20 mg/dL 11 16 15   Creatinine 0.44 - 1.00 mg/dL 2.13 0.86 5.78  Sodium 135 -  145 mmol/L 136 140 132(L)  Potassium 3.5 - 5.1 mmol/L 3.9 4.2 4.1  Chloride 101 - 111 mmol/L 104 105 98(L)  CO2 22 - 32 mmol/L 23 30 28   Calcium 8.9 - 10.3 mg/dL 8.9 9.0 4.6(N)  Total Protein 6.5 - 8.1 g/dL 7.9 6.6 -  Total Bilirubin 0.3 - 1.2 mg/dL 0.5 <0.1(L) -  Alkaline Phos 38 - 126 U/L 61 84 -  AST 15 - 41 U/L 16 27 -  ALT 14 - 54 U/L 12(L) 22 -    No results found for: INR, PROTIME  Imaging: CLINICAL DATA:  Pelvic pain radiating to back since last night. Onset of pain with intercourse. History of endometriosis.  EXAM: CT ABDOMEN AND PELVIS WITH CONTRAST  TECHNIQUE: Multidetector CT imaging of the abdomen and pelvis was performed using the standard protocol following bolus administration of intravenous contrast.  CONTRAST:  ISOVUE-300 IOPAMIDOL (ISOVUE-300) INJECTION 61%  COMPARISON:  04/09/2015  FINDINGS: Lower chest: Clear lung bases. Normal heart size without pericardial or pleural effusion.  Hepatobiliary: Possible too small to characterize left hepatic lobe lesion on image 22/ series 2. Cholecystectomy, without biliary ductal dilatation.  Pancreas: Normal, without mass or ductal dilatation.  Spleen: Normal in size, without focal abnormality.  Adrenals/Urinary Tract: Normal adrenal glands. Interpolar too small to characterize right renal lesion. Normal left kidney, without hydronephrosis or hydroureter. Normal urinary bladder.  Stomach/Bowel: Normal stomach, without wall thickening. Normal caliber of the colon. Proximal appendix normal on image 35 coronal. Difficult to follow distally. Normal terminal ileum. Normal small bowel.  Vascular/Lymphatic: Normal caliber of the aorta and branch vessels. No abdominopelvic adenopathy.  Reproductive: Hysterectomy.  No well-defined adnexal mass.  Other: Moderate edema throughout the pelvis. Suspicion of minimal all extraluminal air and fluid within the central pelvis including on images 68 and  69/series 2. Example fluid measuring 1.7 cm.  Musculoskeletal: No acute osseous abnormality.  IMPRESSION: Moderate pelvic edema with suspicion of trace extraluminal fluid and gas. Considerations include pelvic inflammatory disease and diverticulitis. Consider correlation with pelvic ultrasound. Short-term pelvic CT follow-up may be informative to exclude developing abscess.  These results will be called to the ordering clinician or representative by the Radiologist Assistant, and communication documented in the PACS or zVision Dashboard.   Electronically Signed   By: Jeronimo Greaves M.D.   On: 09/06/2016 15:12  A/P: 25yo with chronic abdominal pain and complex gynecological surgical history (most recently in [facility]- ?myomectomy of remnant uterus? Complicated by urinary issues requiring take-back) who presents with acute onset pelvic pain and vaginal bleeding following intercourse. I'd be concerned about cuff tear. CT with pelvic edema and concern for PID vs diverticulitis. No prior history of colon problems or constipation, no FH of the same. I'd recommend admit for empiric antibiotics and serial exams at this point. If no improvement would consider diagnostic laparoscopy ideally with GYN support.  It would be helpful to know exactly what surgery she had  6 months ago.  Will continue to follow. Thank you for this interesting consult.    Phylliss Blakes, MD [facility], [place] Pager [phone]
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
## 2016-09-06 NOTE — MAU Provider Note (Signed)
History     CSN: [id number]  Arrival date and time: 09/06/16 1102   First Provider Initiated Contact with Patient 09/06/16 1115      Chief Complaint  Patient presents with  . Abdominal Pain  . Vaginal Bleeding   HPI   Ms.Glenda Rodriguez is a 26 y.o. female G0P0000 with a history of chronic abdominal pain with multiple abdominal surgeries here in MAU with abdominal pain.  The patient had intercourse last night and woke up at 0200 with severe abdominal pain. The pain starts in the center and radiates to both sides of her lower abdomen.  Patient noted a scant amount of blood following intercourse. She noted it only when she wiped. The patient came to MAU because the pain started after intercourse. This is not the first time she has had this pain. She has pain after each time she has intercourse.   Most of her surgery's were done in [place].   She had a total hysterectomy at the age of 22 by a Dr. In [place].   OB History    Gravida Para Term Preterm AB Living   0 0 0 0 0 0   SAB TAB Ectopic Multiple Live Births   0 0 0 0        Past Medical History:  Diagnosis Date  . Endometriosis   . Renal disorder     Past Surgical History:  Procedure Laterality Date  . ABDOMINAL HYSTERECTOMY    . BLADDER SURGERY    . CHOLECYSTECTOMY    . ELBOW SURGERY    . TUMOR REMOVAL      No family history on file.  Social History  Substance Use Topics  . Smoking status: Current Every Day Smoker    Packs/day: 1.00  . Smokeless tobacco: Never Used  . Alcohol use No    Allergies:  Allergies  Allergen Reactions  . Morphine And Related     Burning, stabbing pain in back  . Sulfa Antibiotics     Bruising  . Toradol [Ketorolac Tromethamine]     Chest pain    Prescriptions Prior to Admission  Medication Sig Dispense Refill Last Dose  . estradiol (ESTRACE) 1 MG tablet Take 1.5 mg by mouth daily.   09/05/2016 at Unknown time  . ibuprofen (ADVIL,MOTRIN) 200 MG tablet Take 800 mg by mouth  every 6 (six) hours as needed for moderate pain or cramping.   09/05/2016 at Unknown time  . traZODone (DESYREL) 100 MG tablet Take 100 mg by mouth at bedtime.   09/05/2016 at Unknown time   Results for orders placed or performed during the hospital encounter of 09/06/16 (from the past 48 hour(s))  Urinalysis, Routine w reflex microscopic     Status: Abnormal   Collection Time: 09/06/16 11:36 AM  Result Value Ref Range   Color, Urine YELLOW YELLOW   APPearance CLEAR CLEAR   Specific Gravity, Urine 1.021 1.005 - 1.030   pH 5.0 5.0 - 8.0   Glucose, UA NEGATIVE NEGATIVE mg/dL   Hgb urine dipstick SMALL (A) NEGATIVE   Bilirubin Urine NEGATIVE NEGATIVE   Ketones, ur NEGATIVE NEGATIVE mg/dL   Protein, ur NEGATIVE NEGATIVE mg/dL   Nitrite NEGATIVE NEGATIVE   Leukocytes, UA NEGATIVE NEGATIVE   RBC / HPF 0-5 0 - 5 RBC/hpf   WBC, UA 0-5 0 - 5 WBC/hpf   Bacteria, UA NONE SEEN NONE SEEN   Squamous Epithelial / LPF 0-5 (A) NONE SEEN   Mucous PRESENT   Urine  rapid drug screen (hosp performed)     Status: Abnormal   Collection Time: 09/06/16 11:36 AM  Result Value Ref Range   Opiates NONE DETECTED NONE DETECTED   Cocaine NONE DETECTED NONE DETECTED   Benzodiazepines POSITIVE (A) NONE DETECTED   Amphetamines NONE DETECTED NONE DETECTED   Tetrahydrocannabinol POSITIVE (A) NONE DETECTED   Barbiturates NONE DETECTED NONE DETECTED    Comment:        DRUG SCREEN FOR MEDICAL PURPOSES ONLY.  IF CONFIRMATION IS NEEDED FOR ANY PURPOSE, NOTIFY LAB WITHIN 5 DAYS.        LOWEST DETECTABLE LIMITS FOR URINE DRUG SCREEN Drug Class       Cutoff (ng/mL) Amphetamine      1000 Barbiturate      200 Benzodiazepine   545 Tricyclics       625 Opiates          300 Cocaine          300 THC              50   CBC with Differential     Status: Abnormal   Collection Time: 09/06/16 12:00 PM  Result Value Ref Range   WBC 12.8 (H) 4.0 - 10.5 K/uL   RBC 4.53 3.87 - 5.11 MIL/uL   Hemoglobin 13.3 12.0 - 15.0 g/dL    HCT 37.9 36.0 - 46.0 %   MCV 83.7 78.0 - 100.0 fL   MCH 29.4 26.0 - 34.0 pg   MCHC 35.1 30.0 - 36.0 g/dL   RDW 13.1 11.5 - 15.5 %   Platelets 216 150 - 400 K/uL   Neutrophils Relative % 85 %   Neutro Abs 10.9 (H) 1.7 - 7.7 K/uL   Lymphocytes Relative 11 %   Lymphs Abs 1.4 0.7 - 4.0 K/uL   Monocytes Relative 4 %   Monocytes Absolute 0.5 0.1 - 1.0 K/uL   Eosinophils Relative 0 %   Eosinophils Absolute 0.0 0.0 - 0.7 K/uL   Basophils Relative 0 %   Basophils Absolute 0.0 0.0 - 0.1 K/uL  Comprehensive metabolic panel     Status: Abnormal   Collection Time: 09/06/16 12:00 PM  Result Value Ref Range   Sodium 136 135 - 145 mmol/L   Potassium 3.9 3.5 - 5.1 mmol/L   Chloride 104 101 - 111 mmol/L   CO2 23 22 - 32 mmol/L   Glucose, Bld 98 65 - 99 mg/dL   BUN 11 6 - 20 mg/dL   Creatinine, Ser 0.58 0.44 - 1.00 mg/dL   Calcium 8.9 8.9 - 10.3 mg/dL   Total Protein 7.9 6.5 - 8.1 g/dL   Albumin 4.1 3.5 - 5.0 g/dL   AST 16 15 - 41 U/L   ALT 12 (L) 14 - 54 U/L   Alkaline Phosphatase 61 38 - 126 U/L   Total Bilirubin 0.5 0.3 - 1.2 mg/dL   GFR calc non Af Amer >60 >60 mL/min   GFR calc Af Amer >60 >60 mL/min    Comment: (NOTE) The eGFR has been calculated using the CKD EPI equation. This calculation has not been validated in all clinical situations. eGFR's persistently <60 mL/min signify possible Chronic Kidney Disease.    Anion gap 9 5 - 15   Review of Systems  Constitutional: Positive for fever (TMAX at home 99.4).  Gastrointestinal: Positive for diarrhea and nausea. Negative for constipation and vomiting.  Genitourinary: Positive for dyspareunia and vaginal bleeding. Negative for dysuria.   Physical Exam  Blood pressure 120/69, pulse 94, temperature 98.1 F (36.7 C), temperature source Oral, resp. rate 18, SpO2 99 %.  Physical Exam  Constitutional: She is oriented to person, place, and time. She appears well-developed and well-nourished. She appears distressed.  GI: Soft.  Normal appearance. There is tenderness in the right lower quadrant, suprapubic area and left lower quadrant. There is rigidity and guarding. There is no rebound.  Genitourinary:  Genitourinary Comments: Patient not able to tolerate speculum exam   Bimanual exam: Significant tenderness at introitus with bimanual exam.  no masses bilaterally, scant amount of rust colored discharge. No red blood, sign of active bleeding.  Chaperone present for exam.   Musculoskeletal: Normal range of motion.  Neurological: She is alert and oriented to person, place, and time.  Skin: Skin is warm.  Psychiatric: Her behavior is normal.    MAU Course  Procedures  None  MDM CBC, CMP UA Urine rapid drug screen HIV GC Dilaudid IV Zofran  CT scan Discussed with Dr. Elly Modena  Who recommends discussing case with [facility] due to non-GYN issue.  Discussed with Dr. Laverta Baltimore at [facility]. Will transfer for further evaluation.   Assessment and Plan    A:  1. Abdominal pain in female     P:  Transfer to [place] given 2 tabs   Lezlie Lye, NP 09/06/2016 4:37 PM

## 2016-09-07 ENCOUNTER — Inpatient Hospital Stay (HOSPITAL_COMMUNITY): Payer: Self-pay

## 2016-09-07 DIAGNOSIS — R102 Pelvic and perineal pain: Secondary | ICD-10-CM

## 2016-09-07 DIAGNOSIS — F418 Other specified anxiety disorders: Secondary | ICD-10-CM

## 2016-09-07 LAB — COMPREHENSIVE METABOLIC PANEL
ALBUMIN: 3.1 g/dL — AB (ref 3.5–5.0)
ALK PHOS: 47 U/L (ref 38–126)
ALT: 10 U/L — ABNORMAL LOW (ref 14–54)
AST: 15 U/L (ref 15–41)
Anion gap: 6 (ref 5–15)
BILIRUBIN TOTAL: 0.7 mg/dL (ref 0.3–1.2)
BUN: 5 mg/dL — AB (ref 6–20)
CALCIUM: 8.2 mg/dL — AB (ref 8.9–10.3)
CO2: 25 mmol/L (ref 22–32)
CREATININE: 0.67 mg/dL (ref 0.44–1.00)
Chloride: 107 mmol/L (ref 101–111)
GFR calc Af Amer: >60 mL/min (ref >60)
GLUCOSE: 115 mg/dL — AB (ref 65–99)
Potassium: 3.3 mmol/L — ABNORMAL LOW (ref 3.5–5.1)
Sodium: 138 mmol/L (ref 135–145)
TOTAL PROTEIN: 5.7 g/dL — AB (ref 6.5–8.1)

## 2016-09-07 LAB — CBC WITH DIFFERENTIAL/PLATELET
BASOS ABS: 0 10*3/uL (ref 0.0–0.1)
Basophils Relative: 0 %
EOS ABS: 0.1 10*3/uL (ref 0.0–0.7)
EOS PCT: 2 %
HCT: 32.9 % — ABNORMAL LOW (ref 36.0–46.0)
Hemoglobin: 10.8 g/dL — ABNORMAL LOW (ref 12.0–15.0)
LYMPHS PCT: 19 %
Lymphs Abs: 1.6 10*3/uL (ref 0.7–4.0)
MCH: 28.3 pg (ref 26.0–34.0)
MCHC: 32.8 g/dL (ref 30.0–36.0)
MCV: 86.1 fL (ref 78.0–100.0)
Monocytes Absolute: 0.6 10*3/uL (ref 0.1–1.0)
Monocytes Relative: 7 %
NEUTROS PCT: 72 %
Neutro Abs: 6.3 10*3/uL (ref 1.7–7.7)
PLATELETS: 182 10*3/uL (ref 150–400)
RBC: 3.82 MIL/uL — AB (ref 3.87–5.11)
RDW: 13 % (ref 11.5–15.5)
WBC: 8.7 10*3/uL (ref 4.0–10.5)

## 2016-09-07 LAB — GLUCOSE, CAPILLARY: GLUCOSE-CAPILLARY: 115 mg/dL — AB (ref 65–99)

## 2016-09-07 LAB — HIV ANTIBODY (ROUTINE TESTING W REFLEX): HIV Screen 4th Generation wRfx: NONREACTIVE

## 2016-09-07 MED ORDER — NICOTINE 21 MG/24HR TD PT24
21.0000 mg | MEDICATED_PATCH | Freq: Every day | TRANSDERMAL | Status: DC
  Administered 2016-09-07 – 2016-09-08 (×2): 21 mg via TRANSDERMAL
  Filled 2016-09-07 (×3): qty 1

## 2016-09-07 MED ORDER — DOXYCYCLINE HYCLATE 100 MG IV SOLR
100.0000 mg | Freq: Two times a day (BID) | INTRAVENOUS | Status: DC
  Administered 2016-09-07 – 2016-09-08 (×4): 100 mg via INTRAVENOUS
  Filled 2016-09-07 (×5): qty 100

## 2016-09-07 MED ORDER — DEXTROSE 5 % IV SOLN
2.0000 g | Freq: Four times a day (QID) | INTRAVENOUS | Status: DC
  Administered 2016-09-07 – 2016-09-09 (×8): 2 g via INTRAVENOUS
  Filled 2016-09-07 (×10): qty 2

## 2016-09-07 MED ORDER — OXYCODONE HCL 5 MG PO TABS
5.0000 mg | ORAL_TABLET | ORAL | Status: DC | PRN
  Administered 2016-09-07 – 2016-09-09 (×9): 10 mg via ORAL
  Filled 2016-09-07 (×10): qty 2

## 2016-09-07 MED ORDER — POLYETHYLENE GLYCOL 3350 17 G PO PACK
17.0000 g | PACK | Freq: Every day | ORAL | Status: DC
  Filled 2016-09-07 (×3): qty 1

## 2016-09-07 MED ORDER — GI COCKTAIL ~~LOC~~
30.0000 mL | Freq: Two times a day (BID) | ORAL | Status: DC | PRN
  Administered 2016-09-07: 30 mL via ORAL
  Filled 2016-09-07: qty 30

## 2016-09-07 MED ORDER — HYDROMORPHONE HCL 1 MG/ML IJ SOLN
1.0000 mg | INTRAMUSCULAR | Status: DC | PRN
  Administered 2016-09-07 – 2016-09-08 (×6): 1 mg via INTRAVENOUS
  Filled 2016-09-07 (×6): qty 1

## 2016-09-07 MED ORDER — PANTOPRAZOLE SODIUM 40 MG PO TBEC
40.0000 mg | DELAYED_RELEASE_TABLET | Freq: Every day | ORAL | Status: DC
  Administered 2016-09-07 – 2016-09-08 (×2): 40 mg via ORAL
  Filled 2016-09-07 (×3): qty 1

## 2016-09-07 NOTE — Progress Notes (Signed)
Patient and family  given exit care documents on new antibiotics ordered. Will monitor patient. Glenda Rodriguez, [name] RN

## 2016-09-07 NOTE — Progress Notes (Addendum)
PROGRESS NOTE        PATIENT DETAILS Name: Glenda Rodriguez Age: 26 y.o. Sex: female Date of Birth: 04-05-1991 Admit Date: 09/06/2016 Admitting Physician Briscoe Deutscher, MD PCP:No PCP Per Patient  Brief Narrative: Patient is a 26 y.o. 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 CT scan of the abdomen showed pelvic edema and gas. Gen. surgery following with plans for close monitoring and serial exams.  Subjective: Continues to have pelvic pain.  Assessment/Plan: Acute on chronic pelvic pain with vaginal bleeding following sexual intercourse: Continues to have severe pain in her lower pelvis, claims to have minimal spotting/vaginal bleeding. CT of the pelvis showed moderate inflammation, with extraluminal fluid and gas. Gen. surgery following. Will change antimicrobial therapy to cefoxitin and doxycycline. Note-patient has long-standing chronic pain due to endometritis is, she is status post hysterectomy/oophorectomy-per patient not unusual to have pelvic pain following sexual intercourse-however current pain has significantly worse than her usual pain.  Anxiety/depression: Continue trazodone. Urine drug screen positive for benzos-not on medication list. Suspect that anxiety component contributing/playing some part in her pelvic pain.   DVT Prophylaxis: Prophylactic Lovenox   Code Status: Full code   Family Communication: Mother at bedside (spoke after patient gave me permission)  Disposition Plan: Remain inpatient  Antimicrobial agents: Anti-infectives    Start     Dose/Rate Route Frequency Ordered Stop   09/07/16 1100  cefOXitin (MEFOXIN) 2 g in dextrose 5 % 50 mL IVPB     2 g 100 mL/hr over 30 Minutes Intravenous Every 6 hours 09/07/16 0943     09/07/16 1100  doxycycline (VIBRAMYCIN) 100 mg in  dextrose 5 % 250 mL IVPB     100 mg 125 mL/hr over 120 Minutes Intravenous Every 12 hours 09/07/16 0943     09/07/16 0700  levofloxacin (LEVAQUIN) IVPB 500 mg  Status:  Discontinued     500 mg 100 mL/hr over 60 Minutes Intravenous Every 24 hours 09/06/16 2134 09/07/16 0943   09/07/16 0200  metroNIDAZOLE (FLAGYL) tablet 500 mg  Status:  Discontinued     500 mg Oral Every 8 hours 09/06/16 1920 09/07/16 0943   09/06/16 2200  levofloxacin (LEVAQUIN) IVPB 500 mg  Status:  Discontinued     500 mg 100 mL/hr over 60 Minutes Intravenous Every 24 hours 09/06/16 2052 09/06/16 2134   09/06/16 2000  doxycycline (VIBRAMYCIN) 100 mg in dextrose 5 % 250 mL IVPB  Status:  Discontinued     100 mg 125 mL/hr over 120 Minutes Intravenous Every 12 hours 09/06/16 1920 09/06/16 2052   09/06/16 1930  cefTRIAXone (ROCEPHIN) 1 g in dextrose 5 % 50 mL IVPB  Status:  Discontinued     1 g 100 mL/hr over 30 Minutes Intravenous Every 24 hours 09/06/16 1920 09/06/16 2052   09/06/16 1745  ciprofloxacin (CIPRO) IVPB 400 mg     400 mg 200 mL/hr over 60 Minutes Intravenous  Once 09/06/16 1736 09/06/16 2120   09/06/16 1745  metroNIDAZOLE (FLAGYL) IVPB 500 mg     500 mg 100 mL/hr over 60 Minutes Intravenous  Once 09/06/16 1736 09/06/16 1910      Procedures: None  CONSULTS:  general surgery  Time spent: 25- minutes-Greater than 50% of this time was spent  in counseling, explanation of diagnosis, planning of further management, and coordination of care.  MEDICATIONS: Scheduled Meds: . cefOXitin  2 g Intravenous Q6H  . doxycycline (VIBRAMYCIN) IV  100 mg Intravenous Q12H  . enoxaparin (LOVENOX) injection  40 mg Subcutaneous QHS  . estradiol  1.5 mg Oral Daily  . feeding supplement (ENSURE ENLIVE)  237 mL Oral BID BM  . nicotine  21 mg Transdermal Once  . traZODone  100 mg Oral QHS   Continuous Infusions: . sodium chloride 75 mL/hr at 09/07/16 0734   PRN Meds:.acetaminophen, diphenhydrAMINE, HYDROmorphone,  [START ON 09/08/2016] Influenza vac split quadrivalent PF, oxyCODONE-acetaminophen, [START ON 09/08/2016] pneumococcal 23 valent vaccine, promethazine   PHYSICAL EXAM: Vital signs: Vitals:   09/06/16 2015 09/06/16 2043 09/07/16 0323 09/07/16 0859  BP: 101/78 109/70 113/67 (!) 108/55  Pulse: 83 81 83 97  Resp:  18 18 16   Temp:  98.4 F (36.9 C) 98 F (36.7 C) 98.1 F (36.7 C)  TempSrc:  Oral Oral Oral  SpO2: 96% 98% 100% 100%  Weight:   75 kg (165 lb 4.8 oz)   Height:   5\' 10"  (1.778 m)    Filed Weights   09/07/16 0323  Weight: 75 kg (165 lb 4.8 oz)   Body mass index is 23.72 kg/m.   General appearance :Awake, alert, not in any distress. Speech Clear. Anxious Eyes:, pupils equally reactive to light and accomodation,no scleral icterus. HEENT: Atraumatic and Normocephalic Neck: supple, no JVD. No cervical lymphadenopathy.  Resp:Good air entry bilaterally, no added sounds  CVS: S1 S2 regular, no murmurs.  GI: Bowel sounds present, diffusely tender in the pelvic area-but abdomen is soft.  Extremities: B/L Lower Ext shows no edema, both legs are warm to touch Neurology:  speech clear,Non focal, sensation is grossly intact. Psychiatric: Normal judgment and insight. Alert and oriented x 3. Normal mood. Musculoskeletal:No digital cyanosis Skin:No Rash, warm and dry Wounds:N/A  I have personally reviewed following labs and imaging studies  LABORATORY DATA: CBC:  Recent Labs Lab 09/06/16 1200 09/07/16 0212  WBC 12.8* 8.7  NEUTROABS 10.9* 6.3  HGB 13.3 10.8*  HCT 37.9 32.9*  MCV 83.7 86.1  PLT 216 182    Basic Metabolic Panel:  Recent Labs Lab 09/06/16 1200 09/07/16 0212  NA 136 138  K 3.9 3.3*  CL 104 107  CO2 23 25  GLUCOSE 98 115*  BUN 11 5*  CREATININE 0.58 0.67  CALCIUM 8.9 8.2*    GFR: Estimated Creatinine Clearance: 114.2 mL/min (by C-G formula based on SCr of 0.67 mg/dL).  Liver Function Tests:  Recent Labs Lab 09/06/16 1200 09/07/16 0212    AST 16 15  ALT 12* 10*  ALKPHOS 61 47  BILITOT 0.5 0.7  PROT 7.9 5.7*  ALBUMIN 4.1 3.1*   No results for input(s): LIPASE, AMYLASE in the last 168 hours. No results for input(s): AMMONIA in the last 168 hours.  Coagulation Profile: No results for input(s): INR, PROTIME in the last 168 hours.  Cardiac Enzymes: No results for input(s): CKTOTAL, CKMB, CKMBINDEX, TROPONINI in the last 168 hours.  BNP (last 3 results) No results for input(s): PROBNP in the last 8760 hours.  HbA1C: No results for input(s): HGBA1C in the last 72 hours.  CBG:  Recent Labs Lab 09/07/16 0700  GLUCAP 115*    Lipid Profile: No results for input(s): CHOL, HDL, LDLCALC, TRIG, CHOLHDL, LDLDIRECT in the last 72 hours.  Thyroid Function Tests: No results for input(s): TSH, T4TOTAL, FREET4,  T3FREE, THYROIDAB in the last 72 hours.  Anemia Panel: No results for input(s): VITAMINB12, FOLATE, FERRITIN, TIBC, IRON, RETICCTPCT in the last 72 hours.  Urine analysis:    Component Value Date/Time   COLORURINE YELLOW 09/06/2016 1136   APPEARANCEUR CLEAR 09/06/2016 1136   LABSPEC 1.021 09/06/2016 1136   PHURINE 5.0 09/06/2016 1136   GLUCOSEU NEGATIVE 09/06/2016 1136   HGBUR SMALL (A) 09/06/2016 1136   BILIRUBINUR NEGATIVE 09/06/2016 1136   KETONESUR NEGATIVE 09/06/2016 1136   PROTEINUR NEGATIVE 09/06/2016 1136   NITRITE NEGATIVE 09/06/2016 1136   LEUKOCYTESUR NEGATIVE 09/06/2016 1136    Sepsis Labs: Lactic Acid, Venous No results found for: LATICACIDVEN  MICROBIOLOGY: No results found for this or any previous visit (from the past 240 hour(s)).  RADIOLOGY STUDIES/RESULTS: Ct Abdomen Pelvis W Contrast  Result Date: 09/06/2016 CLINICAL DATA:  Pelvic pain radiating to back since last night. Onset of pain with intercourse. History of endometriosis. EXAM: CT ABDOMEN AND PELVIS WITH CONTRAST TECHNIQUE: Multidetector CT imaging of the abdomen and pelvis was performed using the standard protocol  following bolus administration of intravenous contrast. CONTRAST:  ISOVUE-300 IOPAMIDOL (ISOVUE-300) INJECTION 61% COMPARISON:  04/09/2015 FINDINGS: Lower chest: Clear lung bases. Normal heart size without pericardial or pleural effusion. Hepatobiliary: Possible too small to characterize left hepatic lobe lesion on image 22/ series 2. Cholecystectomy, without biliary ductal dilatation. Pancreas: Normal, without mass or ductal dilatation. Spleen: Normal in size, without focal abnormality. Adrenals/Urinary Tract: Normal adrenal glands. Interpolar too small to characterize right renal lesion. Normal left kidney, without hydronephrosis or hydroureter. Normal urinary bladder. Stomach/Bowel: Normal stomach, without wall thickening. Normal caliber of the colon. Proximal appendix normal on image 35 coronal. Difficult to follow distally. Normal terminal ileum. Normal small bowel. Vascular/Lymphatic: Normal caliber of the aorta and branch vessels. No abdominopelvic adenopathy. Reproductive: Hysterectomy.  No well-defined adnexal mass. Other: Moderate edema throughout the pelvis. Suspicion of minimal all extraluminal air and fluid within the central pelvis including on images 68 and 69/series 2. Example fluid measuring 1.7 cm. Musculoskeletal: No acute osseous abnormality. IMPRESSION: Moderate pelvic edema with suspicion of trace extraluminal fluid and gas. Considerations include pelvic inflammatory disease and diverticulitis. Consider correlation with pelvic ultrasound. Short-term pelvic CT follow-up may be informative to exclude developing abscess. These results will be called to the ordering clinician or representative by the Radiologist Assistant, and communication documented in the PACS or zVision Dashboard. Electronically Signed   By: Jeronimo Greaves M.D.   On: 09/06/2016 15:12     LOS: 1 day   Jeoffrey Massed, MD  [facility] Pager:[phone]  If 7PM-7AM, please contact  night-coverage [email or web address] Password [facility] 09/07/2016, 10:19 AM

## 2016-09-07 NOTE — Progress Notes (Signed)
Patient requesting medication for nausea Phenergan given as ordered as needed for nausea will monitor patient Glenda Rodriguez, [name] RN

## 2016-09-07 NOTE — Progress Notes (Signed)
Request/order from MD Dr. Nehemiah Settle for Wet prep and Chlamydia/Ghonorhea test. RN contacted ED for this test. ED charge RN stated that provider does this test in ED, but will send up kit. Spoke with charge nurse on [place], [facility] and Rapid response,  Who also stated that this should be completed by provider not by RN or patient as this is outside of RN scope of practice here at [facility]. Dr. Nehemiah Settle had called back regarding patient and explained this again. Will place equipments for test on in room. Patient made aware that GYN physician would see her tomorrow for follow up.   Release of information from previous hospitalizations and surgeries obtained from patient and family as ordered and faxed  to facilities. Will monitor patient. Brown Dunlap, Bettina Gavia RN

## 2016-09-07 NOTE — Progress Notes (Signed)
Patient requesting medication for itching. Glenda Rodriguez given as ordered as needed for itching. Tyrrell Stephens, LandAmerica FinancialKristin Jessup r N

## 2016-09-07 NOTE — Progress Notes (Signed)
Patient ID: Glenda Rodriguez, female   DOB: 08/04/1990, 26 y.o.   MRN: [id number]  [facility]     Subjective: No improvement. Continues to have diffuse lower abdominal pain R>L. She also reports nausea and vaginal spotting. She is on mefoxin and vibramycin.  Objective: Vital signs in last 24 hours: Temp:  [98 F (36.7 C)-98.6 F (37 C)] 98.6 F (37 C) (02/27 1357) Pulse Rate:  [71-99] 99 (02/27 1357) Resp:  [16-20] 20 (02/27 1357) BP: (101-125)/(55-78) 125/74 (02/27 1357) SpO2:  [96 %-100 %] 100 % (02/27 1357) Weight:  [165 lb 4.8 oz (75 kg)] 165 lb 4.8 oz (75 kg) (02/27 0323) Last BM Date: 09/05/16  Intake/Output from previous day: 02/26 0701 - 02/27 0700 In: 1075 [P.O.:200; I.V.:875] Out: -  Intake/Output this shift: Total I/O In: 660 [P.O.:560; IV Piggyback:100] Out: -   PE: Gen:  Alert, NAD, pleasant Card:  RRR, no M/G/R heard Pulm:  CTAB, no W/R/R, effort norma Abd: Soft, mild distension, +BS, no HSM, tender in bilateral lower quadrants R>L with voluntary guarding Ext:  No erythema, edema, or tenderness  Lab Results:   Recent Labs  09/06/16 1200 09/07/16 0212  WBC 12.8* 8.7  HGB 13.3 10.8*  HCT 37.9 32.9*  PLT 216 182   BMET  Recent Labs  09/06/16 1200 09/07/16 0212  NA 136 138  K 3.9 3.3*  CL 104 107  CO2 23 25  GLUCOSE 98 115*  BUN 11 5*  CREATININE 0.58 0.67  CALCIUM 8.9 8.2*   PT/INR No results for input(s): LABPROT, INR in the last 72 hours. CMP     Component Value Date/Time   NA 138 09/07/2016 0212   K 3.3 (L) 09/07/2016 0212   CL 107 09/07/2016 0212   CO2 25 09/07/2016 0212   GLUCOSE 115 (H) 09/07/2016 0212   BUN 5 (L) 09/07/2016 0212   CREATININE 0.67 09/07/2016 0212   CALCIUM 8.2 (L) 09/07/2016 0212   PROT 5.7 (L) 09/07/2016 0212   ALBUMIN 3.1 (L) 09/07/2016 0212   AST 15 09/07/2016 0212   ALT 10 (L) 09/07/2016 0212   ALKPHOS 47 09/07/2016 0212   BILITOT 0.7 09/07/2016 0212   GFRNONAA >60  09/07/2016 0212   GFRAA >60 09/07/2016 0212   Lipase     Component Value Date/Time   LIPASE 19 (L) 04/09/2015 0929       Studies/Results: Ct Abdomen Pelvis W Contrast  Result Date: 09/06/2016 CLINICAL DATA:  Pelvic pain radiating to back since last night. Onset of pain with intercourse. History of endometriosis. EXAM: CT ABDOMEN AND PELVIS WITH CONTRAST TECHNIQUE: Multidetector CT imaging of the abdomen and pelvis was performed using the standard protocol following bolus administration of intravenous contrast. CONTRAST:  100mL ISOVUE-300 IOPAMIDOL (ISOVUE-300) INJECTION 61% COMPARISON:  04/09/2015 FINDINGS: Lower chest: Clear lung bases. Normal heart size without pericardial or pleural effusion. Hepatobiliary: Possible too small to characterize left hepatic lobe lesion on image 22/ series 2. Cholecystectomy, without biliary ductal dilatation. Pancreas: Normal, without mass or ductal dilatation. Spleen: Normal in size, without focal abnormality. Adrenals/Urinary Tract: Normal adrenal glands. Interpolar too small to characterize right renal lesion. Normal left kidney, without hydronephrosis or hydroureter. Normal urinary bladder. Stomach/Bowel: Normal stomach, without wall thickening. Normal caliber of the colon. Proximal appendix normal on image 35 coronal. Difficult to follow distally. Normal terminal ileum. Normal small bowel. Vascular/Lymphatic: Normal caliber of the aorta and branch vessels. No abdominopelvic adenopathy. Reproductive: Hysterectomy.  No well-defined adnexal mass. Other: Moderate  edema throughout the pelvis. Suspicion of minimal all extraluminal air and fluid within the central pelvis including on images 68 and 69/series 2. Example fluid measuring 1.7 cm. Musculoskeletal: No acute osseous abnormality. IMPRESSION: Moderate pelvic edema with suspicion of trace extraluminal fluid and gas. Considerations include pelvic inflammatory disease and diverticulitis. Consider correlation with  pelvic ultrasound. Short-term pelvic CT follow-up may be informative to exclude developing abscess. These results will be called to the ordering clinician or representative by the Radiologist Assistant, and communication documented in the PACS or zVision Dashboard. Electronically Signed   By: Jeronimo Greaves M.D.   On: 09/06/2016 15:12    Anti-infectives: Anti-infectives    Start     Dose/Rate Route Frequency Ordered Stop   09/07/16 1100  cefOXitin (MEFOXIN) 2 g in dextrose 5 % 50 mL IVPB     2 g 100 mL/hr over 30 Minutes Intravenous Every 6 hours 09/07/16 0943     09/07/16 1100  doxycycline (VIBRAMYCIN) 100 mg in dextrose 5 % 250 mL IVPB     100 mg 125 mL/hr over 120 Minutes Intravenous Every 12 hours 09/07/16 0943     09/07/16 0700  levofloxacin (LEVAQUIN) IVPB 500 mg  Status:  Discontinued     500 mg 100 mL/hr over 60 Minutes Intravenous Every 24 hours 09/06/16 2134 09/07/16 0943   09/07/16 0200  metroNIDAZOLE (FLAGYL) tablet 500 mg  Status:  Discontinued     500 mg Oral Every 8 hours 09/06/16 1920 09/07/16 0943   09/06/16 2200  levofloxacin (LEVAQUIN) IVPB 500 mg  Status:  Discontinued     500 mg 100 mL/hr over 60 Minutes Intravenous Every 24 hours 09/06/16 2052 09/06/16 2134   09/06/16 2000  doxycycline (VIBRAMYCIN) 100 mg in dextrose 5 % 250 mL IVPB  Status:  Discontinued     100 mg 125 mL/hr over 120 Minutes Intravenous Every 12 hours 09/06/16 1920 09/06/16 2052   09/06/16 1930  cefTRIAXone (ROCEPHIN) 1 g in dextrose 5 % 50 mL IVPB  Status:  Discontinued     1 g 100 mL/hr over 30 Minutes Intravenous Every 24 hours 09/06/16 1920 09/06/16 2052   09/06/16 1745  ciprofloxacin (CIPRO) IVPB 400 mg     400 mg 200 mL/hr over 60 Minutes Intravenous  Once 09/06/16 1736 09/06/16 2120   09/06/16 1745  metroNIDAZOLE (FLAGYL) IVPB 500 mg     500 mg 100 mL/hr over 60 Minutes Intravenous  Once 09/06/16 1736 09/06/16 1910       Assessment/Plan Chronic abdominal pain and complex gynecological  surgical history - most recent surgery in [facility]- ?myomectomy of remnant uterus? Complicated by urinary issues requiring take-back - CT with pelvic edema and concern for PID vs diverticulitis.   ID - mefoxin 2/27>>, vibramycin 2/26>> FEN - IVF, regular diet  Plan - With young age and no prior history of colon problems this is highly unlikely to be from diverticulitis. Believe that since she has had multiple gynecological surgeries this is more likely related to this. Would recommend continuing IV antibiotics. Spoke with Dr. Jerral Ralph who has called GYN to see patient. Could consider repeat CT scan if no improvement in the next couple of days. Will continue to follow.   LOS: 1 day    Edson Snowball , [facility] 09/07/2016, 3:22 PM Pager: [phone] Consults: [phone] Mon-Fri 7:00 am-4:30 pm Sat-Sun 7:00 am-11:30 am

## 2016-09-07 NOTE — Progress Notes (Signed)
Patient requesting medication for indigestion MD on call made aware and orders received will monitor patient. Yeng Perz, [name] RN

## 2016-09-07 NOTE — Progress Notes (Addendum)
Patient Name: Glenda ShellCrystal Girouard, female   DOB: 08/16/1990, 26 y.o.  MRN: [id number]  Asked by hospitalist to consult on this patient - consult was listed as non-urgent. Chart reviewed. More information needed - will order pelvic US (Complete pelvic and transvaginal). Will also obtain wet prep and Gc/CT. Orders placed. Either the nurse or patient are able to collect these swabs.   The patient is currently under appropriate antibiotic therapy for PID, which should also cover GI pathogens. In general, for definitive treatment of endometriosis, both salpinx and ovaries are removed. As the patient is currently undergoing hormone replacement with estrace, I would assume that the patient's ovaries were removed in addition to the uterus and salpinx. Formal consult to follow after US.  Would also recommend anti-inflammatories for inflammatory process in lieu to escalating narcotics.  Levie HeritageJacob J Amery Minasyan, DO 09/07/2016 4:18 PM

## 2016-09-08 ENCOUNTER — Encounter (HOSPITAL_COMMUNITY): Payer: Self-pay | Admitting: Obstetrics & Gynecology

## 2016-09-08 DIAGNOSIS — N760 Acute vaginitis: Secondary | ICD-10-CM

## 2016-09-08 HISTORY — DX: Acute vaginitis: N76.0

## 2016-09-08 LAB — BASIC METABOLIC PANEL
ANION GAP: 7 (ref 5–15)
BUN: <5 mg/dL — ABNORMAL LOW (ref 6–20)
CHLORIDE: 101 mmol/L (ref 101–111)
CO2: 28 mmol/L (ref 22–32)
Calcium: 9.3 mg/dL (ref 8.9–10.3)
Creatinine, Ser: 0.8 mg/dL (ref 0.44–1.00)
GFR calc Af Amer: >60 mL/min (ref >60)
GLUCOSE: 102 mg/dL — AB (ref 65–99)
POTASSIUM: 3.8 mmol/L (ref 3.5–5.1)
Sodium: 136 mmol/L (ref 135–145)

## 2016-09-08 LAB — WET PREP, GENITAL
CLUE CELLS WET PREP: NONE SEEN
Sperm: NONE SEEN
TRICH WET PREP: NONE SEEN
Yeast Wet Prep HPF POC: NONE SEEN

## 2016-09-08 LAB — CBC
HEMATOCRIT: 35.4 % — AB (ref 36.0–46.0)
HEMOGLOBIN: 11.7 g/dL — AB (ref 12.0–15.0)
MCH: 28.5 pg (ref 26.0–34.0)
MCHC: 33.1 g/dL (ref 30.0–36.0)
MCV: 86.1 fL (ref 78.0–100.0)
PLATELETS: 191 10*3/uL (ref 150–400)
RBC: 4.11 MIL/uL (ref 3.87–5.11)
RDW: 12.8 % (ref 11.5–15.5)
WBC: 9.7 10*3/uL (ref 4.0–10.5)

## 2016-09-08 LAB — GLUCOSE, CAPILLARY: Glucose-Capillary: 91 mg/dL (ref 65–99)

## 2016-09-08 MED ORDER — ACETAMINOPHEN 500 MG PO TABS
1000.0000 mg | ORAL_TABLET | Freq: Three times a day (TID) | ORAL | Status: DC
  Administered 2016-09-08 – 2016-09-09 (×3): 1000 mg via ORAL
  Filled 2016-09-08 (×3): qty 2

## 2016-09-08 MED ORDER — METHOCARBAMOL 500 MG PO TABS
500.0000 mg | ORAL_TABLET | Freq: Three times a day (TID) | ORAL | Status: DC | PRN
  Administered 2016-09-08 – 2016-09-09 (×3): 500 mg via ORAL
  Filled 2016-09-08 (×3): qty 1

## 2016-09-08 MED ORDER — GABAPENTIN 600 MG PO TABS
300.0000 mg | ORAL_TABLET | Freq: Three times a day (TID) | ORAL | Status: DC
  Administered 2016-09-08 – 2016-09-09 (×4): 300 mg via ORAL
  Filled 2016-09-08 (×4): qty 1

## 2016-09-08 MED ORDER — SENNOSIDES-DOCUSATE SODIUM 8.6-50 MG PO TABS
2.0000 | ORAL_TABLET | Freq: Every day | ORAL | Status: DC
  Administered 2016-09-08: 2 via ORAL
  Filled 2016-09-08 (×2): qty 2

## 2016-09-08 NOTE — Progress Notes (Signed)
Patient ID: Glenda Rodriguez, female   DOB: 1990/12/31, 26 y.o.   MRN: [id number]  [facility] Surgery Progress Note     Subjective: No change in pain. Seen by GYN Dr. Macon Large. Vaginal u/s showed vaginal cuff inflammation/cellulitis. They are recommend continuing antibiotic therapy for now (Cefoxitin and Doxycycline), discharge to home on Augmentin 875/125 mg po bid x 14 days, and referral to [facility] for ultimate treatment.  Objective: Vital signs in last 24 hours: Temp:  [98.6 F (37 C)-99.4 F (37.4 C)] 98.6 F (37 C) (02/28 0644) Pulse Rate:  [89-107] 89 (02/28 0644) Resp:  [18-20] 18 (02/28 0644) BP: (117-125)/(62-74) 117/62 (02/28 0644) SpO2:  [99 %-100 %] 99 % (02/28 0644) Weight:  [164 lb 6.4 oz (74.6 kg)] 164 lb 6.4 oz (74.6 kg) (02/28 0247) Last BM Date: 09/05/16  Intake/Output from previous day: 02/27 0701 - 02/28 0700 In: 2151.4 [P.O.:1040; I.V.:591.4; IV Piggyback:400] Out: 3450 [Urine:3450] Intake/Output this shift: Total I/O In: 240 [P.O.:240] Out: -   PE: Gen:  Alert, NAD, pleasant Card:  RRR, no M/G/R heard Pulm:  CTAB, no W/R/R, effort norma Abd: Soft, mild distension, +BS, no HSM, tender in bilateral lower quadrants R>L with voluntary guarding Ext:  No erythema, edema, or tenderness  Lab Results:   Recent Labs  09/07/16 0212 09/08/16 0217  WBC 8.7 9.7  HGB 10.8* 11.7*  HCT 32.9* 35.4*  PLT 182 191   BMET  Recent Labs  09/07/16 0212 09/08/16 0217  NA 138 136  K 3.3* 3.8  CL 107 101  CO2 25 28  GLUCOSE 115* 102*  BUN 5* <5*  CREATININE 0.67 0.80  CALCIUM 8.2* 9.3   PT/INR No results for input(s): LABPROT, INR in the last 72 hours. CMP     Component Value Date/Time   NA 136 09/08/2016 0217   K 3.8 09/08/2016 0217   CL 101 09/08/2016 0217   CO2 28 09/08/2016 0217   GLUCOSE 102 (H) 09/08/2016 0217   BUN <5 (L) 09/08/2016 0217   CREATININE 0.80 09/08/2016 0217   CALCIUM 9.3 09/08/2016 0217   PROT 5.7 (L) 09/07/2016 0212   ALBUMIN  3.1 (L) 09/07/2016 0212   AST 15 09/07/2016 0212   ALT 10 (L) 09/07/2016 0212   ALKPHOS 47 09/07/2016 0212   BILITOT 0.7 09/07/2016 0212   GFRNONAA >60 09/08/2016 0217   GFRAA >60 09/08/2016 0217   Lipase     Component Value Date/Time   LIPASE 19 (L) 04/09/2015 0929       Studies/Results: US Transvaginal Non-ob  Result Date: 09/08/2016 CLINICAL DATA:  Pelvic pain for 2 days. Post hysterectomy and bilateral salpingo-oophorectomy. EXAM: TRANSABDOMINAL AND TRANSVAGINAL ULTRASOUND OF PELVIS TECHNIQUE: Both transabdominal and transvaginal ultrasound examinations of the pelvis were performed. Transabdominal technique was performed for global imaging of the pelvis including uterus, ovaries, adnexal regions, and pelvic cul-de-sac. It was necessary to proceed with endovaginal exam following the transabdominal exam to visualize the adnexa. COMPARISON:  CT abdomen/pelvis yesterday at 1426 hour FINDINGS: Uterus Surgically absent. Endometrium Surgically absent. Right ovary Surgically absent. Left ovary Surgically absent. Other findings Heterogeneous area in the midline has both echogenic and hypoechoic components, of uncertain etiology. IMPRESSION: 1. Post hysterectomy and bilateral salpingo-oophorectomy. 2. Complex heterogeneous region in the pelvic midline with both echogenic and hypoechoic components which likely corresponds to inflammatory change seen on CT, however is nonspecific. Electronically Signed   By: Rubye Oaks M.D.   On: 09/08/2016 00:58   US Pelvis Complete  Result Date:  09/08/2016 CLINICAL DATA:  Pelvic pain for 2 days. Post hysterectomy and bilateral salpingo-oophorectomy. EXAM: TRANSABDOMINAL AND TRANSVAGINAL ULTRASOUND OF PELVIS TECHNIQUE: Both transabdominal and transvaginal ultrasound examinations of the pelvis were performed. Transabdominal technique was performed for global imaging of the pelvis including uterus, ovaries, adnexal regions, and pelvic cul-de-sac. It was  necessary to proceed with endovaginal exam following the transabdominal exam to visualize the adnexa. COMPARISON:  CT abdomen/pelvis yesterday at 1426 hour FINDINGS: Uterus Surgically absent. Endometrium Surgically absent. Right ovary Surgically absent. Left ovary Surgically absent. Other findings Heterogeneous area in the midline has both echogenic and hypoechoic components, of uncertain etiology. IMPRESSION: 1. Post hysterectomy and bilateral salpingo-oophorectomy. 2. Complex heterogeneous region in the pelvic midline with both echogenic and hypoechoic components which likely corresponds to inflammatory change seen on CT, however is nonspecific. Electronically Signed   By: Rubye Oaks M.D.   On: 09/08/2016 00:58   Ct Abdomen Pelvis W Contrast  Result Date: 09/06/2016 CLINICAL DATA:  Pelvic pain radiating to back since last night. Onset of pain with intercourse. History of endometriosis. EXAM: CT ABDOMEN AND PELVIS WITH CONTRAST TECHNIQUE: Multidetector CT imaging of the abdomen and pelvis was performed using the standard protocol following bolus administration of intravenous contrast. CONTRAST:  ISOVUE-300 IOPAMIDOL (ISOVUE-300) INJECTION 61% COMPARISON:  04/09/2015 FINDINGS: Lower chest: Clear lung bases. Normal heart size without pericardial or pleural effusion. Hepatobiliary: Possible too small to characterize left hepatic lobe lesion on image 22/ series 2. Cholecystectomy, without biliary ductal dilatation. Pancreas: Normal, without mass or ductal dilatation. Spleen: Normal in size, without focal abnormality. Adrenals/Urinary Tract: Normal adrenal glands. Interpolar too small to characterize right renal lesion. Normal left kidney, without hydronephrosis or hydroureter. Normal urinary bladder. Stomach/Bowel: Normal stomach, without wall thickening. Normal caliber of the colon. Proximal appendix normal on image 35 coronal. Difficult to follow distally. Normal terminal ileum. Normal small bowel.  Vascular/Lymphatic: Normal caliber of the aorta and branch vessels. No abdominopelvic adenopathy. Reproductive: Hysterectomy.  No well-defined adnexal mass. Other: Moderate edema throughout the pelvis. Suspicion of minimal all extraluminal air and fluid within the central pelvis including on images 68 and 69/series 2. Example fluid measuring 1.7 cm. Musculoskeletal: No acute osseous abnormality. IMPRESSION: Moderate pelvic edema with suspicion of trace extraluminal fluid and gas. Considerations include pelvic inflammatory disease and diverticulitis. Consider correlation with pelvic ultrasound. Short-term pelvic CT follow-up may be informative to exclude developing abscess. These results will be called to the ordering clinician or representative by the Radiologist Assistant, and communication documented in the PACS or zVision Dashboard. Electronically Signed   By: Jeronimo Greaves M.D.   On: 09/06/2016 15:12    Anti-infectives: Anti-infectives    Start     Dose/Rate Route Frequency Ordered Stop   09/07/16 1100  cefOXitin (MEFOXIN) 2 g in dextrose 5 % 50 mL IVPB     2 g 100 mL/hr over 30 Minutes Intravenous Every 6 hours 09/07/16 0943     09/07/16 1100  doxycycline (VIBRAMYCIN) 100 mg in dextrose 5 % 250 mL IVPB     100 mg 125 mL/hr over 120 Minutes Intravenous Every 12 hours 09/07/16 0943     09/07/16 0700  levofloxacin (LEVAQUIN) IVPB 500 mg  Status:  Discontinued     500 mg 100 mL/hr over 60 Minutes Intravenous Every 24 hours 09/06/16 2134 09/07/16 0943   09/07/16 0200  metroNIDAZOLE (FLAGYL) tablet 500 mg  Status:  Discontinued     500 mg Oral Every 8 hours 09/06/16 1920 09/07/16 0943  09/06/16 2200  levofloxacin (LEVAQUIN) IVPB 500 mg  Status:  Discontinued     500 mg 100 mL/hr over 60 Minutes Intravenous Every 24 hours 09/06/16 2052 09/06/16 2134   09/06/16 2000  doxycycline (VIBRAMYCIN) 100 mg in dextrose 5 % 250 mL IVPB  Status:  Discontinued     100 mg 125 mL/hr over 120 Minutes Intravenous  Every 12 hours 09/06/16 1920 09/06/16 2052   09/06/16 1930  cefTRIAXone (ROCEPHIN) 1 g in dextrose 5 % 50 mL IVPB  Status:  Discontinued     1 g 100 mL/hr over 30 Minutes Intravenous Every 24 hours 09/06/16 1920 09/06/16 2052   09/06/16 1745  ciprofloxacin (CIPRO) IVPB 400 mg     400 mg 200 mL/hr over 60 Minutes Intravenous  Once 09/06/16 1736 09/06/16 2120   09/06/16 1745  metroNIDAZOLE (FLAGYL) IVPB 500 mg     500 mg 100 mL/hr over 60 Minutes Intravenous  Once 09/06/16 1736 09/06/16 1910       Assessment/Plan Chronic abdominal pain and complex gynecological surgical history - [facility] [place] ?myomectomy of remnant uterus? Complicated by urinary issues requiring take-back - CT with pelvic edema and concern for PID vs diverticulitis.  - vaginal u/s showed vaginal cuff inflammation/cellulitis  ID - mefoxin 2/27>>, vibramycin 2/26>> FEN - IVF, regular diet  Plan - Treatment plan as outlined by GYN for vaginal cuff inflammation/cellulitis. General surgery will sign off, please call with concerns.   LOS: 2 days    Edson SnowballBROOKE A MILLER , [facility] 09/08/2016, 10:28 AM Pager: [phone] Consults: [phone] Mon-Fri 7:00 am-4:30 pm Sat-Sun 7:00 am-11:30 am

## 2016-09-08 NOTE — Progress Notes (Signed)
PROGRESS NOTE        PATIENT DETAILS Name: Glenda Rodriguez Age: 26 y.o. Sex: female Date of Birth: 07-May-1991 Admit Date: 09/06/2016 Admitting Physician Briscoe Deutscher, MD PCP:No PCP Per Patient  Brief Narrative: Patient is a 26 y.o. 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 CT scan of the abdomen showed pelvic edema and gas. Gen. surgery and GYN were consulted, see below for further details.  Subjective: Continues to have pelvic pain-but lools comfortable  Assessment/Plan: Acute on chronic vaginal cuff inflammation: Continue with empiric antibiotics, spoke with GYN M.D. today, recommendations are for 2 weeks of Augmentin on discharge and continued supportive care. They will arrange for outpatient [facility] at [facility].Note-patient has long-standing chronic pain due to endometritis is, she is status post hysterectomy/oophorectomy-per patient not unusual to have pelvic pain following sexual intercourse-however current pain has significantly worse than her usual pain. Patient was instructed by GYN to refrain from sexual intercourse. Spoke with patient and mother at length today, plans are to discontinue IV narcotics, and see if her pain can be controlled with oral narcotics-and potential discharge on 2/29.  Anxiety/depression: Continue trazodone. Urine drug screen positive for benzos-not on medication list. Suspect that anxiety component contributing/playing some part in her pelvic pain.   DVT Prophylaxis: Prophylactic Lovenox   Code Status: Full code   Family Communication: Mother at bedside   Disposition Plan: Remain inpatient  Antimicrobial agents: Anti-infectives    Start     Dose/Rate Route Frequency Ordered Stop   09/07/16 1100  cefOXitin (MEFOXIN) 2 g in dextrose 5 % 50 mL IVPB     2 g 100 mL/hr over 30 Minutes Intravenous Every 6 hours 09/07/16 0943     09/07/16 1100  doxycycline (VIBRAMYCIN) 100 mg in dextrose 5 % 250 mL IVPB     100 mg 125 mL/hr over 120 Minutes Intravenous Every 12 hours 09/07/16 0943     09/07/16 0700  levofloxacin (LEVAQUIN) IVPB 500 mg  Status:  Discontinued     500 mg 100 mL/hr over 60 Minutes Intravenous Every 24 hours 09/06/16 2134 09/07/16 0943   09/07/16 0200  metroNIDAZOLE (FLAGYL) tablet 500 mg  Status:  Discontinued     500 mg Oral Every 8 hours 09/06/16 1920 09/07/16 0943   09/06/16 2200  levofloxacin (LEVAQUIN) IVPB 500 mg  Status:  Discontinued     500 mg 100 mL/hr over 60 Minutes Intravenous Every 24 hours 09/06/16 2052 09/06/16 2134   09/06/16 2000  doxycycline (VIBRAMYCIN) 100 mg in dextrose 5 % 250 mL IVPB  Status:  Discontinued     100 mg 125 mL/hr over 120 Minutes Intravenous Every 12 hours 09/06/16 1920 09/06/16 2052   09/06/16 1930  cefTRIAXone (ROCEPHIN) 1 g in dextrose 5 % 50 mL IVPB  Status:  Discontinued     1 g 100 mL/hr over 30 Minutes Intravenous Every 24 hours 09/06/16 1920 09/06/16 2052   09/06/16 1745  ciprofloxacin (CIPRO) IVPB 400 mg     400 mg 200 mL/hr over 60 Minutes Intravenous  Once 09/06/16 1736 09/06/16 2120   09/06/16 1745  metroNIDAZOLE (FLAGYL) IVPB 500 mg     500 mg 100 mL/hr over 60 Minutes Intravenous  Once 09/06/16 1736 09/06/16 1910  Procedures: None  CONSULTS:  general surgery  Time spent: 25- minutes-Greater than 50% of this time was spent in counseling, explanation of diagnosis, planning of further management, and coordination of care.  MEDICATIONS: Scheduled Meds: . acetaminophen  1,000 mg Oral Q8H  . cefOXitin  2 g Intravenous Q6H  . doxycycline (VIBRAMYCIN) IV  100 mg Intravenous Q12H  . enoxaparin (LOVENOX) injection  40 mg Subcutaneous QHS  . estradiol  1.5 mg Oral Daily  . feeding supplement (ENSURE ENLIVE)  237 mL Oral BID BM  . gabapentin  300 mg Oral TID  .  nicotine  21 mg Transdermal Daily  . pantoprazole  40 mg Oral Daily  . polyethylene glycol  17 g Oral Daily  . senna-docusate  2 tablet Oral Daily  . traZODone  100 mg Oral QHS   Continuous Infusions:  PRN Meds:.diphenhydrAMINE, gi cocktail, Influenza vac split quadrivalent PF, methocarbamol, oxyCODONE, pneumococcal 23 valent vaccine, promethazine   PHYSICAL EXAM: Vital signs: Vitals:   09/07/16 2054 09/07/16 2325 09/08/16 0247 09/08/16 0644  BP: 120/72   117/62  Pulse: (!) 107   89  Resp: 18   18  Temp: 99.4 F (37.4 C) 98.8 F (37.1 C)  98.6 F (37 C)  TempSrc: Oral Oral  Oral  SpO2: 99%   99%  Weight:   74.6 kg (164 lb 6.4 oz)   Height:       Filed Weights   09/07/16 0323 09/08/16 0247  Weight: 75 kg (165 lb 4.8 oz) 74.6 kg (164 lb 6.4 oz)   Body mass index is 23.59 kg/m.   General appearance :Awake, alert, not in any distress. Speech Clear. Anxious Eyes:, pupils equally reactive to light and accomodation,no scleral icterus. HEENT: Atraumatic and Normocephalic Neck: supple, no JVD. No cervical lymphadenopathy.  Resp:Good air entry bilaterally, no added sounds  CVS: S1 S2 regular, no murmurs.  GI: Bowel sounds present, diffusely tender in the pelvic area-but abdomen is soft.  Extremities: B/L Lower Ext shows no edema, both legs are warm to touch Neurology:  speech clear,Non focal, sensation is grossly intact. Psychiatric: Normal judgment and insight. Alert and oriented x 3. Normal mood. Musculoskeletal:No digital cyanosis Skin:No Rash, warm and dry Wounds:N/A  I have personally reviewed following labs and imaging studies  LABORATORY DATA: CBC:  Recent Labs Lab 09/06/16 1200 09/07/16 0212 09/08/16 0217  WBC 12.8* 8.7 9.7  NEUTROABS 10.9* 6.3  --   HGB 13.3 10.8* 11.7*  HCT 37.9 32.9* 35.4*  MCV 83.7 86.1 86.1  PLT 216 182 191    Basic Metabolic Panel:  Recent Labs Lab 09/06/16 1200 09/07/16 0212 09/08/16 0217  NA 136 138 136  K 3.9 3.3* 3.8    CL 104 107 101  CO2 23 25 28   GLUCOSE 98 115* 102*  BUN 11 5* <5*  CREATININE 0.58 0.67 0.80  CALCIUM 8.9 8.2* 9.3    GFR: Estimated Creatinine Clearance: 114.2 mL/min (by C-G formula based on SCr of 0.8 mg/dL).  Liver Function Tests:  Recent Labs Lab 09/06/16 1200 09/07/16 0212  AST 16 15  ALT 12* 10*  ALKPHOS 61 47  BILITOT 0.5 0.7  PROT 7.9 5.7*  ALBUMIN 4.1 3.1*   No results for input(s): LIPASE, AMYLASE in the last 168 hours. No results for input(s): AMMONIA in the last 168 hours.  Coagulation Profile: No results for input(s): INR, PROTIME in the last 168 hours.  Cardiac Enzymes: No results for input(s): CKTOTAL, CKMB, CKMBINDEX, TROPONINI in the last  168 hours.  BNP (last 3 results) No results for input(s): PROBNP in the last 8760 hours.  HbA1C: No results for input(s): HGBA1C in the last 72 hours.  CBG:  Recent Labs Lab 09/07/16 0700 09/08/16 0607  GLUCAP 115* 91    Lipid Profile: No results for input(s): CHOL, HDL, LDLCALC, TRIG, CHOLHDL, LDLDIRECT in the last 72 hours.  Thyroid Function Tests: No results for input(s): TSH, T4TOTAL, FREET4, T3FREE, THYROIDAB in the last 72 hours.  Anemia Panel: No results for input(s): VITAMINB12, FOLATE, FERRITIN, TIBC, IRON, RETICCTPCT in the last 72 hours.  Urine analysis:    Component Value Date/Time   COLORURINE YELLOW 09/06/2016 1136   APPEARANCEUR CLEAR 09/06/2016 1136   LABSPEC 1.021 09/06/2016 1136   PHURINE 5.0 09/06/2016 1136   GLUCOSEU NEGATIVE 09/06/2016 1136   HGBUR SMALL (A) 09/06/2016 1136   BILIRUBINUR NEGATIVE 09/06/2016 1136   KETONESUR NEGATIVE 09/06/2016 1136   PROTEINUR NEGATIVE 09/06/2016 1136   NITRITE NEGATIVE 09/06/2016 1136   LEUKOCYTESUR NEGATIVE 09/06/2016 1136    Sepsis Labs: Lactic Acid, Venous No results found for: LATICACIDVEN  MICROBIOLOGY: Recent Results (from the past 240 hour(s))  Wet prep, genital     Status: Abnormal   Collection Time: 09/08/16 10:09 AM   Result Value Ref Range Status   Yeast Wet Prep HPF POC NONE SEEN NONE SEEN Final    Comment: Specimen diluted due to transport tube containing more than 1 ml of saline, interpret results with caution.   Trich, Wet Prep NONE SEEN NONE SEEN Final   Clue Cells Wet Prep HPF POC NONE SEEN NONE SEEN Final   WBC, Wet Prep HPF POC MANY (A) NONE SEEN Final   Sperm NONE SEEN  Final    RADIOLOGY STUDIES/RESULTS: US Transvaginal Non-ob  Result Date: 09/08/2016 CLINICAL DATA:  Pelvic pain for 2 days. Post hysterectomy and bilateral salpingo-oophorectomy. EXAM: TRANSABDOMINAL AND TRANSVAGINAL ULTRASOUND OF PELVIS TECHNIQUE: Both transabdominal and transvaginal ultrasound examinations of the pelvis were performed. Transabdominal technique was performed for global imaging of the pelvis including uterus, ovaries, adnexal regions, and pelvic cul-de-sac. It was necessary to proceed with endovaginal exam following the transabdominal exam to visualize the adnexa. COMPARISON:  CT abdomen/pelvis yesterday at 1426 hour FINDINGS: Uterus Surgically absent. Endometrium Surgically absent. Right ovary Surgically absent. Left ovary Surgically absent. Other findings Heterogeneous area in the midline has both echogenic and hypoechoic components, of uncertain etiology. IMPRESSION: 1. Post hysterectomy and bilateral salpingo-oophorectomy. 2. Complex heterogeneous region in the pelvic midline with both echogenic and hypoechoic components which likely corresponds to inflammatory change seen on CT, however is nonspecific. Electronically Signed   By: Rubye Oaks M.D.   On: 09/08/2016 00:58   US Pelvis Complete  Result Date: 09/08/2016 CLINICAL DATA:  Pelvic pain for 2 days. Post hysterectomy and bilateral salpingo-oophorectomy. EXAM: TRANSABDOMINAL AND TRANSVAGINAL ULTRASOUND OF PELVIS TECHNIQUE: Both transabdominal and transvaginal ultrasound examinations of the pelvis were performed. Transabdominal technique was performed for  global imaging of the pelvis including uterus, ovaries, adnexal regions, and pelvic cul-de-sac. It was necessary to proceed with endovaginal exam following the transabdominal exam to visualize the adnexa. COMPARISON:  CT abdomen/pelvis yesterday at 1426 hour FINDINGS: Uterus Surgically absent. Endometrium Surgically absent. Right ovary Surgically absent. Left ovary Surgically absent. Other findings Heterogeneous area in the midline has both echogenic and hypoechoic components, of uncertain etiology. IMPRESSION: 1. Post hysterectomy and bilateral salpingo-oophorectomy. 2. Complex heterogeneous region in the pelvic midline with both echogenic and hypoechoic components which likely corresponds to  inflammatory change seen on CT, however is nonspecific. Electronically Signed   By: Rubye Oaks M.D.   On: 09/08/2016 00:58   Ct Abdomen Pelvis W Contrast  Result Date: 09/06/2016 CLINICAL DATA:  Pelvic pain radiating to back since last night. Onset of pain with intercourse. History of endometriosis. EXAM: CT ABDOMEN AND PELVIS WITH CONTRAST TECHNIQUE: Multidetector CT imaging of the abdomen and pelvis was performed using the standard protocol following bolus administration of intravenous contrast. CONTRAST:  ISOVUE-300 IOPAMIDOL (ISOVUE-300) INJECTION 61% COMPARISON:  04/09/2015 FINDINGS: Lower chest: Clear lung bases. Normal heart size without pericardial or pleural effusion. Hepatobiliary: Possible too small to characterize left hepatic lobe lesion on image 22/ series 2. Cholecystectomy, without biliary ductal dilatation. Pancreas: Normal, without mass or ductal dilatation. Spleen: Normal in size, without focal abnormality. Adrenals/Urinary Tract: Normal adrenal glands. Interpolar too small to characterize right renal lesion. Normal left kidney, without hydronephrosis or hydroureter. Normal urinary bladder. Stomach/Bowel: Normal stomach, without wall thickening. Normal caliber of the colon. Proximal appendix  normal on image 35 coronal. Difficult to follow distally. Normal terminal ileum. Normal small bowel. Vascular/Lymphatic: Normal caliber of the aorta and branch vessels. No abdominopelvic adenopathy. Reproductive: Hysterectomy.  No well-defined adnexal mass. Other: Moderate edema throughout the pelvis. Suspicion of minimal all extraluminal air and fluid within the central pelvis including on images 68 and 69/series 2. Example fluid measuring 1.7 cm. Musculoskeletal: No acute osseous abnormality. IMPRESSION: Moderate pelvic edema with suspicion of trace extraluminal fluid and gas. Considerations include pelvic inflammatory disease and diverticulitis. Consider correlation with pelvic ultrasound. Short-term pelvic CT follow-up may be informative to exclude developing abscess. These results will be called to the ordering clinician or representative by the Radiologist Assistant, and communication documented in the PACS or zVision Dashboard. Electronically Signed   By: Jeronimo Greaves M.D.   On: 09/06/2016 15:12     LOS: 2 days   Jeoffrey Massed, MD  [facility] Pager:[phone]  If 7PM-7AM, please contact night-coverage [email or web address] Password [facility] 09/08/2016, 12:52 PM

## 2016-09-08 NOTE — Progress Notes (Signed)
Pt requesting medication for nausea, phenergan given as ordered as needed for nausea. Katanya Schlie, [name] RN

## 2016-09-08 NOTE — Progress Notes (Signed)
Pt requesting medicaiton for itiching, benadryl giving as ordered as needed for itching. Norrin Shreffler, [name] RN
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
## 2016-09-09 LAB — GLUCOSE, CAPILLARY: Glucose-Capillary: 96 mg/dL (ref 65–99)

## 2016-09-09 LAB — GC/CHLAMYDIA PROBE AMP (~~LOC~~) NOT AT ARMC
CHLAMYDIA, DNA PROBE: NEGATIVE
NEISSERIA GONORRHEA: NEGATIVE

## 2016-09-09 MED ORDER — SENNOSIDES-DOCUSATE SODIUM 8.6-50 MG PO TABS: 2.0000 | ORAL_TABLET | Freq: Every day | ORAL | 0 refills | Status: DC

## 2016-09-09 MED ORDER — GABAPENTIN 300 MG PO CAPS: 300.0000 mg | ORAL_CAPSULE | Freq: Three times a day (TID) | ORAL | 0 refills | Status: AC

## 2016-09-09 MED ORDER — ENSURE ENLIVE PO LIQD: 237.0000 mL | Freq: Two times a day (BID) | ORAL | 0 refills | Status: DC

## 2016-09-09 MED ORDER — METHOCARBAMOL 500 MG PO TABS: 500.0000 mg | ORAL_TABLET | Freq: Three times a day (TID) | ORAL | 0 refills | Status: AC | PRN

## 2016-09-09 MED ORDER — CYCLOBENZAPRINE HCL 10 MG PO TABS
10.0000 mg | ORAL_TABLET | Freq: Once | ORAL | Status: AC
  Administered 2016-09-09: 10 mg via ORAL
  Filled 2016-09-09: qty 1

## 2016-09-09 MED ORDER — OXYCODONE HCL 10 MG PO TABS
10.0000 mg | ORAL_TABLET | Freq: Four times a day (QID) | ORAL | 0 refills | Status: DC | PRN
Start: 2016-09-09 — End: 2017-03-22

## 2016-09-09 MED ORDER — OXYCODONE HCL 5 MG PO TABS
10.0000 mg | ORAL_TABLET | Freq: Once | ORAL | Status: AC
  Administered 2016-09-09: 10 mg via ORAL
  Filled 2016-09-09: qty 2

## 2016-09-09 MED ORDER — POLYETHYLENE GLYCOL 3350 17 G PO PACK: 17.0000 g | PACK | Freq: Every day | ORAL | 0 refills | Status: DC

## 2016-09-09 MED ORDER — AMOXICILLIN-POT CLAVULANATE 875-125 MG PO TABS: 1.0000 | ORAL_TABLET | Freq: Two times a day (BID) | ORAL | 0 refills | Status: DC

## 2016-09-09 NOTE — Care Management Note (Signed)
Case Management Note  Patient Details  Name: Glenda Rodriguez MRN: [id number] Date of Birth: 01/30/1991  Subjective/Objective:                 [facility] Will DC to home today.   Action/Plan:   Expected Discharge Date:  09/09/16               Expected Discharge Plan:  Home/Self Care  In-House Referral:     Discharge planning Services  CM Consult, [facility] Program, Indigent Health Clinic  Post Acute Care Choice:    Choice offered to:     DME Arranged:    DME Agency:     HH Arranged:    HH Agency:     Status of Service:  Completed, signed off  If discussed at [employer] Meetings, dates discussed:    Additional Comments:  Lawerance SabalDebbie Argie Lober, RN 09/09/2016, 9:50 AM

## 2016-09-09 NOTE — Discharge Summary (Signed)
PATIENT DETAILS Name: Glenda Rodriguez Age: 26 y.o. Sex: female Date of Birth: 10/30/1990 MRN: [id number]. Admitting Physician: Briscoe Deutscherimothy S Opyd, MD PCP:No PCP Per Patient  Admit Date: 09/06/2016 Discharge date: 09/09/2016  Recommendations for Outpatient Follow-up:  1. Follow up with PCP in 1-2 weeks 2. Please obtain BMP/CBC in one week 3. Please follow up on the following pending results:  Admitted From:  Home  Disposition: Home   Home Health: No  Equipment/Devices: None  Discharge Condition: Stable  CODE STATUS: FULL CODE  Diet recommendation:  Regular   Brief Summary: See H&P, Labs, Consult and Test reports for all details in brief,Patient is a 26 y.o. 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 CT scan of the abdomen showed pelvic edema and gas. Gen. surgery and GYN were consulted, see below for further details patient was admitted for    Brief Hospital Course: Acute on chronic vaginal cuff inflammation:  patient was admitted and started on empiric cefoxitin and doxycycline. Gen. surgery and GYN were consulted during this hospital stay. It is now thought she has acute on chronic vaginal cuff inflammation, no further recommendations from general surgery-GYN recommending 2 week course of empiric Augmentin, as needed narcotics for pain. They will arrange outpatient follow[facility] at [facility]. Please note, patient required IV narcotics to control her pain during this hospital stay, she has not been transitioned to oral narcotics-which has relatively controlled her pain over the past 24 hours. She seems comfortable this morning, continues to have mild tenderness in the pelvic area. She has been recommended to refrain from sexual intercourse by a GYN, this was reemphasized with the patient this morning. This patient  unfortunately has had a long-standing history of endometritis is s/p total abdominal hysterectomy [place] 6 years back. Per patient, it is not unusual to have pain after sexual intercourse, however the pelvic pain that she had on admission was significantly worse or than a usual pain. Per GYN, she will need referral to a tertiary center for a multidisciplinary treatment plan. Both patient and family aware that she will get a call from GYN office regarding [facility]. I have provided this patient 30 tablets of oxycodone on discharge, I have also asked her to nonnarcotic alternatives, before using narcotics to relieve her pain.  Anxiety/depression: Continue trazodone. Suspect that anxiety component contributing/playing some part in her pelvic pain.   Procedures/Studies: None  Discharge Diagnoses:  Principal Problem:   Vaginal cuff cellulitis Active Problems:   Pelvic pain   Anxiety and depression   Vaginal bleeding   Discharge Instructions:  Activity:  As tolerated with Full fall precautions use walker/cane & assistance as needed   Discharge Instructions    Ambulatory referral to Gynecology    Complete by:  As directed    [facility]  For further evaluation and possible surgical management of chronic vaginal cuff cellulitis Complex GYN surgical history; s/p hysterectomy, BSO for severe endometriosis 5-6 years ago, had vaginal cuff lesion that was removed 6 months ago and she had problems since   Diet general    Complete by:  As directed    Discharge instructions    Complete by:  As directed    Follow with Primary MD  In 1 week  GYN will call you with a follow up appointment [facility]  Please get a complete blood count and chemistry panel checked by your Primary MD at your next visit, and again as instructed by your Primary MD.  Get Medicines reviewed and adjusted: Please take all your medications with  you for your next visit with your Primary MD  Laboratory/radiological data: Please request your Primary MD to go over all hospital tests and procedure/radiological results at the follow up, please ask your Primary MD to get all Hospital records sent to his/her office.  In some cases, they will be blood work, cultures and biopsy results pending at the time of your discharge. Please request that your primary care M.D. follows up on these results.  Also Note the following: If you experience worsening of your admission symptoms, develop shortness of breath, life threatening emergency, suicidal or homicidal thoughts you must seek medical attention immediately by calling 911 or calling your MD immediately  if symptoms less severe.  You must read complete instructions/literature along with all the possible adverse reactions/side effects for all the Medicines you take and that have been prescribed to you. Take any new Medicines after you have completely understood and accpet all the possible adverse reactions/side effects.   Do not drive when taking Pain medications or sleeping medications (Benzodaizepines)  Do not take more than prescribed Pain, Sleep and Anxiety Medications. It is not advisable to combine anxiety,sleep and pain medications without talking with your primary care practitioner  Special Instructions: If you have smoked or chewed Tobacco  in the last 2 yrs please stop smoking, stop any regular Alcohol  and or any Recreational drug use.  Wear Seat belts while driving.  Please note: You were cared for by a hospitalist during your hospital stay. Once you are discharged, your primary care physician will handle any further medical issues. Please note that NO REFILLS for any discharge medications will be authorized once you are discharged, as it is imperative that you return to your primary care physician (or establish a relationship with a primary care physician if you do not have one) for your  post hospital discharge needs so that they can reassess your need for medications and monitor your lab values.     Allergies as of 09/09/2016      Reactions   Morphine And Related    Burning, stabbing pain in back   Sulfa Antibiotics    Bruising   Toradol [ketorolac Tromethamine]    Chest pain      Medication List    TAKE these medications   amoxicillin-clavulanate 875-125 MG tablet Commonly known as:  AUGMENTIN Take 1 tablet by mouth 2 (two) times daily.   estradiol 1 MG tablet Commonly known as:  ESTRACE Take 1.5 mg by mouth daily.   feeding supplement (ENSURE ENLIVE) Liqd Take 237 mLs by mouth 2 (two) times daily between meals.   gabapentin 300 MG capsule Commonly known as:  NEURONTIN Take 1 capsule (300 mg total) by mouth 3 (three) times daily.   ibuprofen 200 MG tablet Commonly known as:  ADVIL,MOTRIN Take 800 mg by mouth every 6 (six) hours as needed for moderate pain or cramping.   methocarbamol 500 MG tablet Commonly known as:  ROBAXIN Take 1 tablet (500 mg total) by mouth every 8 (eight) hours as needed for muscle spasms.   Oxycodone HCl 10 MG Tabs Take 1 tablet (10 mg total) by mouth every 6 (six) hours as needed for moderate pain.   polyethylene glycol packet Commonly known as:  MIRALAX / GLYCOLAX Take  17 g by mouth daily.   senna-docusate 8.6-50 MG tablet Commonly known as:  Senokot-S Take 2 tablets by mouth daily.   traZODone 100 MG tablet Commonly known as:  DESYREL Take 100 mg by mouth at bedtime.       Allergies  Allergen Reactions  . Morphine And Related     Burning, stabbing pain in back  . Sulfa Antibiotics     Bruising  . Toradol [Ketorolac Tromethamine]     Chest pain    Consultations:   general surgery and gynecology   Other Procedures/Studies: US Transvaginal Non-ob  Result Date: 09/08/2016 CLINICAL DATA:  Pelvic pain for 2 days. Post hysterectomy and bilateral salpingo-oophorectomy. EXAM: TRANSABDOMINAL AND TRANSVAGINAL  ULTRASOUND OF PELVIS TECHNIQUE: Both transabdominal and transvaginal ultrasound examinations of the pelvis were performed. Transabdominal technique was performed for global imaging of the pelvis including uterus, ovaries, adnexal regions, and pelvic cul-de-sac. It was necessary to proceed with endovaginal exam following the transabdominal exam to visualize the adnexa. COMPARISON:  CT abdomen/pelvis yesterday at 1426 hour FINDINGS: Uterus Surgically absent. Endometrium Surgically absent. Right ovary Surgically absent. Left ovary Surgically absent. Other findings Heterogeneous area in the midline has both echogenic and hypoechoic components, of uncertain etiology. IMPRESSION: 1. Post hysterectomy and bilateral salpingo-oophorectomy. 2. Complex heterogeneous region in the pelvic midline with both echogenic and hypoechoic components which likely corresponds to inflammatory change seen on CT, however is nonspecific. Electronically Signed   By: Rubye Oaks M.D.   On: 09/08/2016 00:58   US Pelvis Complete  Result Date: 09/08/2016 CLINICAL DATA:  Pelvic pain for 2 days. Post hysterectomy and bilateral salpingo-oophorectomy. EXAM: TRANSABDOMINAL AND TRANSVAGINAL ULTRASOUND OF PELVIS TECHNIQUE: Both transabdominal and transvaginal ultrasound examinations of the pelvis were performed. Transabdominal technique was performed for global imaging of the pelvis including uterus, ovaries, adnexal regions, and pelvic cul-de-sac. It was necessary to proceed with endovaginal exam following the transabdominal exam to visualize the adnexa. COMPARISON:  CT abdomen/pelvis yesterday at 1426 hour FINDINGS: Uterus Surgically absent. Endometrium Surgically absent. Right ovary Surgically absent. Left ovary Surgically absent. Other findings Heterogeneous area in the midline has both echogenic and hypoechoic components, of uncertain etiology. IMPRESSION: 1. Post hysterectomy and bilateral salpingo-oophorectomy. 2. Complex heterogeneous  region in the pelvic midline with both echogenic and hypoechoic components which likely corresponds to inflammatory change seen on CT, however is nonspecific. Electronically Signed   By: Rubye Oaks M.D.   On: 09/08/2016 00:58   Ct Abdomen Pelvis W Contrast  Result Date: 09/06/2016 CLINICAL DATA:  Pelvic pain radiating to back since last night. Onset of pain with intercourse. History of endometriosis. EXAM: CT ABDOMEN AND PELVIS WITH CONTRAST TECHNIQUE: Multidetector CT imaging of the abdomen and pelvis was performed using the standard protocol following bolus administration of intravenous contrast. CONTRAST:  ISOVUE-300 IOPAMIDOL (ISOVUE-300) INJECTION 61% COMPARISON:  04/09/2015 FINDINGS: Lower chest: Clear lung bases. Normal heart size without pericardial or pleural effusion. Hepatobiliary: Possible too small to characterize left hepatic lobe lesion on image 22/ series 2. Cholecystectomy, without biliary ductal dilatation. Pancreas: Normal, without mass or ductal dilatation. Spleen: Normal in size, without focal abnormality. Adrenals/Urinary Tract: Normal adrenal glands. Interpolar too small to characterize right renal lesion. Normal left kidney, without hydronephrosis or hydroureter. Normal urinary bladder. Stomach/Bowel: Normal stomach, without wall thickening. Normal caliber of the colon. Proximal appendix normal on image 35 coronal. Difficult to follow distally. Normal terminal ileum. Normal small bowel. Vascular/Lymphatic: Normal caliber of the aorta and branch vessels. No abdominopelvic adenopathy.  Reproductive: Hysterectomy.  No well-defined adnexal mass. Other: Moderate edema throughout the pelvis. Suspicion of minimal all extraluminal air and fluid within the central pelvis including on images 68 and 69/series 2. Example fluid measuring 1.7 cm. Musculoskeletal: No acute osseous abnormality. IMPRESSION: Moderate pelvic edema with suspicion of trace extraluminal fluid and gas. Considerations  include pelvic inflammatory disease and diverticulitis. Consider correlation with pelvic ultrasound. Short-term pelvic CT follow-up may be informative to exclude developing abscess. These results will be called to the ordering clinician or representative by the Radiologist Assistant, and communication documented in the PACS or zVision Dashboard. Electronically Signed   By: Jeronimo Greaves M.D.   On: 09/06/2016 15:12      TODAY-DAY OF DISCHARGE:  Subjective:   Glenda Rodriguez today has no headache,no chest abdominal pain,no new weakness tingling or numbness, feels much better wants to go home today.  Objective:   Blood pressure (!) 98/55, pulse 81, temperature 98.7 F (37.1 C), temperature source Oral, resp. rate 18, height 5\' 10"  (1.778 m), weight 74.2 kg (163 lb 8 oz), SpO2 100 %.  Intake/Output Summary (Last 24 hours) at 09/09/16 0754 Last data filed at 09/08/16 2050  Gross per 24 hour  Intake              960 ml  Output             1100 ml  Net             -140 ml   Filed Weights   09/07/16 0323 09/08/16 0247 09/09/16 0047  Weight: 75 kg (165 lb 4.8 oz) 74.6 kg (164 lb 6.4 oz) 74.2 kg (163 lb 8 oz)    Exam: Awake Alert, Oriented *3, No new F.N deficits, Normal affect [place].AT,PERRAL Supple Neck,No JVD, No cervical lymphadenopathy appriciated.  Symmetrical Chest wall movement, Good air movement bilaterally, CTAB RRR,No Gallops,Rubs or new Murmurs, No Parasternal Heave +ve B.Sounds, Abd Soft, mildly tender in the pelvic area, No organomegaly appriciated, No rebound -guarding or rigidity. No Cyanosis, Clubbing or edema, No new Rash or bruise   PERTINENT RADIOLOGIC STUDIES: US Transvaginal Non-ob  Result Date: 09/08/2016 CLINICAL DATA:  Pelvic pain for 2 days. Post hysterectomy and bilateral salpingo-oophorectomy. EXAM: TRANSABDOMINAL AND TRANSVAGINAL ULTRASOUND OF PELVIS TECHNIQUE: Both transabdominal and transvaginal ultrasound examinations of the pelvis were performed.  Transabdominal technique was performed for global imaging of the pelvis including uterus, ovaries, adnexal regions, and pelvic cul-de-sac. It was necessary to proceed with endovaginal exam following the transabdominal exam to visualize the adnexa. COMPARISON:  CT abdomen/pelvis yesterday at 1426 hour FINDINGS: Uterus Surgically absent. Endometrium Surgically absent. Right ovary Surgically absent. Left ovary Surgically absent. Other findings Heterogeneous area in the midline has both echogenic and hypoechoic components, of uncertain etiology. IMPRESSION: 1. Post hysterectomy and bilateral salpingo-oophorectomy. 2. Complex heterogeneous region in the pelvic midline with both echogenic and hypoechoic components which likely corresponds to inflammatory change seen on CT, however is nonspecific. Electronically Signed   By: Rubye Oaks M.D.   On: 09/08/2016 00:58   US Pelvis Complete  Result Date: 09/08/2016 CLINICAL DATA:  Pelvic pain for 2 days. Post hysterectomy and bilateral salpingo-oophorectomy. EXAM: TRANSABDOMINAL AND TRANSVAGINAL ULTRASOUND OF PELVIS TECHNIQUE: Both transabdominal and transvaginal ultrasound examinations of the pelvis were performed. Transabdominal technique was performed for global imaging of the pelvis including uterus, ovaries, adnexal regions, and pelvic cul-de-sac. It was necessary to proceed with endovaginal exam following the transabdominal exam to visualize the adnexa. COMPARISON:  CT abdomen/pelvis yesterday  at 1426 hour FINDINGS: Uterus Surgically absent. Endometrium Surgically absent. Right ovary Surgically absent. Left ovary Surgically absent. Other findings Heterogeneous area in the midline has both echogenic and hypoechoic components, of uncertain etiology. IMPRESSION: 1. Post hysterectomy and bilateral salpingo-oophorectomy. 2. Complex heterogeneous region in the pelvic midline with both echogenic and hypoechoic components which likely corresponds to inflammatory change  seen on CT, however is nonspecific. Electronically Signed   By: Rubye Oaks M.D.   On: 09/08/2016 00:58   Ct Abdomen Pelvis W Contrast  Result Date: 09/06/2016 CLINICAL DATA:  Pelvic pain radiating to back since last night. Onset of pain with intercourse. History of endometriosis. EXAM: CT ABDOMEN AND PELVIS WITH CONTRAST TECHNIQUE: Multidetector CT imaging of the abdomen and pelvis was performed using the standard protocol following bolus administration of intravenous contrast. CONTRAST:  ISOVUE-300 IOPAMIDOL (ISOVUE-300) INJECTION 61% COMPARISON:  04/09/2015 FINDINGS: Lower chest: Clear lung bases. Normal heart size without pericardial or pleural effusion. Hepatobiliary: Possible too small to characterize left hepatic lobe lesion on image 22/ series 2. Cholecystectomy, without biliary ductal dilatation. Pancreas: Normal, without mass or ductal dilatation. Spleen: Normal in size, without focal abnormality. Adrenals/Urinary Tract: Normal adrenal glands. Interpolar too small to characterize right renal lesion. Normal left kidney, without hydronephrosis or hydroureter. Normal urinary bladder. Stomach/Bowel: Normal stomach, without wall thickening. Normal caliber of the colon. Proximal appendix normal on image 35 coronal. Difficult to follow distally. Normal terminal ileum. Normal small bowel. Vascular/Lymphatic: Normal caliber of the aorta and branch vessels. No abdominopelvic adenopathy. Reproductive: Hysterectomy.  No well-defined adnexal mass. Other: Moderate edema throughout the pelvis. Suspicion of minimal all extraluminal air and fluid within the central pelvis including on images 68 and 69/series 2. Example fluid measuring 1.7 cm. Musculoskeletal: No acute osseous abnormality. IMPRESSION: Moderate pelvic edema with suspicion of trace extraluminal fluid and gas. Considerations include pelvic inflammatory disease and diverticulitis. Consider correlation with pelvic ultrasound. Short-term pelvic CT  follow-up may be informative to exclude developing abscess. These results will be called to the ordering clinician or representative by the Radiologist Assistant, and communication documented in the PACS or zVision Dashboard. Electronically Signed   By: Jeronimo Greaves M.D.   On: 09/06/2016 15:12     PERTINENT LAB RESULTS: CBC:  Recent Labs  09/07/16 0212 09/08/16 0217  WBC 8.7 9.7  HGB 10.8* 11.7*  HCT 32.9* 35.4*  PLT 182 191   CMET CMP     Component Value Date/Time   NA 136 09/08/2016 0217   K 3.8 09/08/2016 0217   CL 101 09/08/2016 0217   CO2 28 09/08/2016 0217   GLUCOSE 102 (H) 09/08/2016 0217   BUN <5 (L) 09/08/2016 0217   CREATININE 0.80 09/08/2016 0217   CALCIUM 9.3 09/08/2016 0217   PROT 5.7 (L) 09/07/2016 0212   ALBUMIN 3.1 (L) 09/07/2016 0212   AST 15 09/07/2016 0212   ALT 10 (L) 09/07/2016 0212   ALKPHOS 47 09/07/2016 0212   BILITOT 0.7 09/07/2016 0212   GFRNONAA >60 09/08/2016 0217   GFRAA >60 09/08/2016 0217    GFR Estimated Creatinine Clearance: 114.2 mL/min (by C-G formula based on SCr of 0.8 mg/dL). No results for input(s): LIPASE, AMYLASE in the last 72 hours. No results for input(s): CKTOTAL, CKMB, CKMBINDEX, TROPONINI in the last 72 hours. Invalid input(s): POCBNP No results for input(s): DDIMER in the last 72 hours. No results for input(s): HGBA1C in the last 72 hours. No results for input(s): CHOL, HDL, LDLCALC, TRIG, CHOLHDL, LDLDIRECT in the last  72 hours. No results for input(s): TSH, T4TOTAL, T3FREE, THYROIDAB in the last 72 hours.  Invalid input(s): FREET3 No results for input(s): VITAMINB12, FOLATE, FERRITIN, TIBC, IRON, RETICCTPCT in the last 72 hours. Coags: No results for input(s): INR in the last 72 hours.  Invalid input(s): PT Microbiology: Recent Results (from the past 240 hour(s))  Wet prep, genital     Status: Abnormal   Collection Time: 09/08/16 10:09 AM  Result Value Ref Range Status   Yeast Wet Prep HPF POC NONE SEEN NONE  SEEN Final    Comment: Specimen diluted due to transport tube containing more than 1 ml of saline, interpret results with caution.   Trich, Wet Prep NONE SEEN NONE SEEN Final   Clue Cells Wet Prep HPF POC NONE SEEN NONE SEEN Final   WBC, Wet Prep HPF POC MANY (A) NONE SEEN Final   Sperm NONE SEEN  Final    FURTHER DISCHARGE INSTRUCTIONS:  Get Medicines reviewed and adjusted: Please take all your medications with you for your next visit with your Primary MD  Laboratory/radiological data: Please request your Primary MD to go over all hospital tests and procedure/radiological results at the follow up, please ask your Primary MD to get all Hospital records sent to his/her office.  In some cases, they will be blood work, cultures and biopsy results pending at the time of your discharge. Please request that your primary care M.D. goes through all the records of your hospital data and follows up on these results.  Also Note the following: If you experience worsening of your admission symptoms, develop shortness of breath, life threatening emergency, suicidal or homicidal thoughts you must seek medical attention immediately by calling 911 or calling your MD immediately  if symptoms less severe.  You must read complete instructions/literature along with all the possible adverse reactions/side effects for all the Medicines you take and that have been prescribed to you. Take any new Medicines after you have completely understood and accpet all the possible adverse reactions/side effects.   Do not drive when taking Pain medications or sleeping medications (Benzodaizepines)  Do not take more than prescribed Pain, Sleep and Anxiety Medications. It is not advisable to combine anxiety,sleep and pain medications without talking with your primary care practitioner  Special Instructions: If you have smoked or chewed Tobacco  in the last 2 yrs please stop smoking, stop any regular Alcohol  and or any  Recreational drug use.  Wear Seat belts while driving.  Please note: You were cared for by a hospitalist during your hospital stay. Once you are discharged, your primary care physician will handle any further medical issues. Please note that NO REFILLS for any discharge medications will be authorized once you are discharged, as it is imperative that you return to your primary care physician (or establish a relationship with a primary care physician if you do not have one) for your post hospital discharge needs so that they can reassess your need for medications and monitor your lab values.  Total Time spent coordinating discharge including counseling, education and face to face time equals 45 minutes.  SignedJeoffrey Massed 09/09/2016 7:54 AM

## 2016-09-16 ENCOUNTER — Telehealth: Payer: Self-pay

## 2016-09-16 NOTE — Telephone Encounter (Signed)
Pt called the front office and wanted to know about the referral for [facility].  I contacted Dr. Macon LargeAnyanwu in regards to referral due to pt consult per Anyanwu.  Provider informed me that she had already submitted the referral form and demographics as [facility] requested and that it was up to pt to f/u with [facility] in regards to referral.  Looking in pt's chart, pt has an appt scheduled with an OB/GYN next week, Dr. Jean [place]Jackson in [place].  Upon asking pt in regards to that appt she stated that she would prefer to have the [facility] appt because she did not have insurance and she was told that they would work with her.  I informed pt that she can contact [facility] @ [phone] to f/u with referral.  I also advised pt that she could also request to see if they are a seeing new patients as well.  Pt stated understanding with no further questions.

## 2016-09-21 ENCOUNTER — Ambulatory Visit (INDEPENDENT_AMBULATORY_CARE_PROVIDER_SITE_OTHER): Payer: Self-pay | Admitting: Obstetrics and Gynecology

## 2016-09-21 ENCOUNTER — Encounter: Payer: Self-pay | Admitting: Obstetrics and Gynecology

## 2016-09-21 VITALS — BP 116/70 | HR 58 | Ht 69.0 in | Wt 167.0 lb

## 2016-09-21 DIAGNOSIS — T8131XA Disruption of external operation (surgical) wound, not elsewhere classified, initial encounter: Secondary | ICD-10-CM | POA: Insufficient documentation

## 2016-09-21 DIAGNOSIS — T81328A Disruption or dehiscence of closure of other specified internal operation (surgical) wound, initial encounter: Secondary | ICD-10-CM | POA: Insufficient documentation

## 2016-09-21 DIAGNOSIS — E2839 Other primary ovarian failure: Secondary | ICD-10-CM

## 2016-09-21 NOTE — Progress Notes (Signed)
Obstetrics & Gynecology Office Visit    Chief Complaint  Patient presents with  . Vaginal Bleeding    tumor removed 02/2016 from MD in [place]  . Dyspareunia    History of Present Illness: 26 y.o. female who presents to discuss management of complications she has had since her surgery in August of last year.  She was recently hospitalized at [facility] from 2/26 - 09/09/16 for what was called "acute on chronic vaginal cuff inflammation." Notably, the patient has a very significant gynecologic history. She underwent a total abdominal hysterectomy and unilateral salpingo-oophorectomy at the age of 52. She later had the contralateral salpingo-oophorectomy and has since been on some form of hormone replacement. She states that in August she was living in [place] and underwent a procedure to remove a "tumor" from her vaginal cuff, internally. She does not know the pathology of this tumor. Since the surgery she has had multiple ER visits. More recently, she noted pain with intercourse and abnormal bleeding. She also felt like she was sick and so presented to the ER, which resulted in her aforementioned hospitalization. Upon discharge from this hospitalization she was referred to [facility], as her condition likely requires a tertiary care center setting. The patient had previously scheduled appointment with me (prior to her hospitalization), and had not heard from [facility] yet. So, she decided to keep this appointment. She states that during her hospitalization she was on several antibiotics and pain medications. She was discharged on Augmentin. I personally reviewed the imaging reports and labs and notes from that hospitalization. Her CT scan showed some fluid and free air in her pelvis, which initially caused alarm and general surgery was consulted.  She denies vaginal bleeding and abnormal discharge today. She does state, however, that she still has significant pain. She denies fevers, chills, nausea and  vomiting.  Review of Systems: Review of Systems  Constitutional: Negative for chills and fever.  HENT: Negative.   Eyes: Negative.   Respiratory: Negative.   Cardiovascular: Negative.   Gastrointestinal: Positive for abdominal pain and constipation. Negative for blood in stool, nausea and vomiting.  Genitourinary: Negative.        Unless noted in HPI  Musculoskeletal: Negative.   Skin: Negative.  Negative for itching and rash.  Neurological: Negative for dizziness and headaches.  Endo/Heme/Allergies: Negative.   Psychiatric/Behavioral: Negative for depression and suicidal ideas.    Past Medical History:  Diagnosis Date  . Endometriosis   . Renal disorder   . Vaginal cuff cellulitis 09/08/2016   Past Surgical History:  Procedure Laterality Date  . ABDOMINAL HYSTERECTOMY    . BILATERAL SALPINGOOPHORECTOMY     different surgeries, per patient report  . BLADDER SURGERY    . CHOLECYSTECTOMY    . ELBOW SURGERY    . TUMOR REMOVAL      Gynecologic History: No LMP recorded. Patient has had a hysterectomy.  Obstetric History: G0P0000  Family History  Problem Relation Age of Onset  . Hypercholesterolemia Father   . Hypertension Father   . Ovarian cancer Paternal Grandmother    Social History   Social History  . Marital status: Divorced    Spouse name: N/A  . Number of children: N/A  . Years of education: N/A   Occupational History  . Not on file.   Social History Main Topics  . Smoking status: Current Every Day Smoker    Packs/day: 1.00  . Smokeless tobacco: Never Used  . Alcohol use No  .  Drug use: No  . Sexual activity: Yes   Other Topics Concern  . Not on file   Social History Narrative  . No narrative on file    Allergies  Allergen Reactions  . Morphine And Related     Burning, stabbing pain in back  . Sulfa Antibiotics     Bruising  . Toradol [Ketorolac Tromethamine]     Chest pain    Medications:   Medication Sig Start Date End Date Taking?  Authorizing Provider  estradiol (ESTRACE) 1 MG tablet Take 1.5 mg by mouth daily.   Yes Historical Provider, MD  gabapentin (NEURONTIN) 300 MG capsule Take 1 capsule (300 mg total) by mouth 3 (three) times daily. 09/09/16  Yes Shanker Levora DredgeM Ghimire, MD  methocarbamol (ROBAXIN) 500 MG tablet Take 1 tablet (500 mg total) by mouth every 8 (eight) hours as needed for muscle spasms. 09/09/16  Yes Shanker Levora DredgeM Ghimire, MD  oxyCODONE 10 MG TABS Take 1 tablet (10 mg total) by mouth every 6 (six) hours as needed for moderate pain. 09/09/16  Yes Shanker Levora DredgeM Ghimire, MD  traZODone (DESYREL) 100 MG tablet Take 100 mg by mouth at bedtime.   Yes Historical Provider, MD    Physical Exam BP 116/70 (Patient Position: Sitting)   Pulse (!) 58   Ht 5\' 9"  (1.753 m)   Wt 167 lb (75.8 kg)   BMI 24.66 kg/m   No LMP recorded. Patient has had a hysterectomy.  General: NAD HEENT: normocephalic, anicteric Thyroid: no enlargement, no palpable nodules Pulmonary: No increased work of breathing Cardiovascular: RRR, distal pulses 2+ Abdomen: NABS, soft, non-tender, non-distended.  Umbilicus without lesions.  No hepatomegaly, splenomegaly or masses palpable. No evidence of hernia  Genitourinary: deferred Extremities: no edema, erythema, or tenderness Neurologic: Grossly intact Psychiatric: mood appropriate, affect full  Assessment: 26 y.o. G0P0000 with chronic vaginal cuff dehiscence.  Plan: This patient presents with a complicated gynecologic history, involving multiple surgeries and hospitalizations. Her most recent consultation with a gynecologist while hospitalized resulted in a referral to a tertiary care center. I agree with this estimation given her records from her recent hospitalization. She has an active referral to [facility] in [facility] from her doctor in [place], Dr. Jaynie CollinsAnyanwu Ugonna.  I will defer treatment to [facility] and Dr. Vela ProseUgonna for the immediate future. The patient agrees with this recommendation and plan. The patient does  express some frustration with not having heard from [facility] at this point. If necessary, I will see if [facility] could be contacted to get an idea of the status of the referral.  30 minutes spent in face to face discussion with > 50% spent in counseling and management of her chronic vaginal cuff dehiscence.   Thomasene MohairStephen Darthy Manganelli, MD 09/21/2016 5:35 PM

## 2016-09-22 ENCOUNTER — Telehealth: Payer: Self-pay | Admitting: Obstetrics and Gynecology

## 2016-09-22 ENCOUNTER — Telehealth: Payer: Self-pay | Admitting: *Deleted

## 2016-09-22 NOTE — Telephone Encounter (Signed)
Pt left message stating that she was supposed to have a referral ordered by Dr. Macon LargeAnyanwu 2 weeks ago and has not received a call with an appointment. Please call back

## 2016-09-22 NOTE — Telephone Encounter (Signed)
Patient is aware the referral submitted by Dr Macon LargeAnyanwu was done through [facility] but I do not see any other information regarding her referral, if it did not go through Dr Mont DuttonAnyanwu's office would have received a message but I would not be able to see that, and that she needs to call Dr Mont DuttonAnyanwu's office.

## 2016-09-22 NOTE — Telephone Encounter (Signed)
At this point I think the best bet is to go with Dr. Mont DuttonAnyanwu's referral.  Is there any way you could call her office to facilitate this?  The main issue is that the patient is calling her office and (according the patient) is being told there is nothing else to be done, that the referral is in place.  I'm not sure how long the patient needs to wait to hear from [facility] before she becomes concerned that there is a problem with the referral.  I did see her yesterday, but essentially I deferred all management to [facility]. I think it is more appropriate for Dr Mont DuttonAnyanwu's office to refer given that she has been evaluated by her and will have the best notes to send [facility].  I am in a bit of an awkward position with this since she has been to see me.  Let's try to do all we can to facilitate what is going on.

## 2016-09-23 NOTE — Telephone Encounter (Signed)
I spoke to the patient who has left a message at Dr. Mont DuttonAnyanwu's office. Patient is aware that the referral needs to come from Dr Mont DuttonAnyanwu's office due being evaluated there and that I will contact the referral coordinator. Patient has my phone# and ext. Also, the patient asked if her prescription had been sent to [facility] on [address] yet.

## 2016-09-24 MED ORDER — ESTRADIOL 2 MG PO TABS: 2.0000 mg | ORAL_TABLET | Freq: Every day | ORAL | 0 refills | Status: AC

## 2016-09-24 NOTE — Telephone Encounter (Signed)
I spoke to the patient to let her know the medication was sent today. Also, I contacted Dr Mont DuttonAnyanwu's office but someone from after hours triage answered and said their office closes at 11:30am on Fridays. I called the patient to let her know, and told her I would be out of the office on Monday but would try again on Tuesday.

## 2016-09-24 NOTE — Telephone Encounter (Signed)
Please let the patient know that the medication was sent into [facility] just now.  Sorry for the delay. Not sure what the glitch was.

## 2016-09-24 NOTE — Addendum Note (Signed)
Addended by: Thomasene MohairJACKSON, Lorne Winkels D on: 09/24/2016 01:46 PM   Modules accepted: Orders

## 2016-09-27 NOTE — Telephone Encounter (Signed)
I called Glenda Rodriguez back and informed her per notes that Dr. Macon LargeAnyanwu had sent referral and she should call number to set up appointment. She states the numbers she called she couldn't get anything done - was told referral sent , not sent, etc. I gave her the number noted in previous call and infomred her is that does not work- to call us back. I also instructed her to ask about making appointment for financial assistance- as I saw she states she was told they would work with her.  She voices understanding.

## 2016-09-28 ENCOUNTER — Telehealth: Payer: Self-pay

## 2016-09-28 NOTE — Telephone Encounter (Signed)
[facility] Dr [facility] Dutton[facility]wu's office, who said they [facility] the [facility] and faxed requested demographics and notes. She does not know why [facility] told the patient they do not have the referral but will f/u today and then contact the patient. She also said the patient was seen by Dr Macon LargeAnyanwu in the ER, has never been seen in the office, and she will ask the patient whether she plans to establish care with them. I gave Dee my phone# and ext. I spoke to the patient to let her know to expect a call from Dr Mont DuttonAnyanwu's office after they contact [facility].

## 2016-09-28 NOTE — Telephone Encounter (Signed)
Received call from [place] at [facility] who stated patient was seen in there office regarding pelvic pain. She is wanting to know who is working on her referral to see a PELVIC PAIN Specialist at [facility]. I advised NANCY that patient was seen in the ER by our provider and all requested information was sent to [facility] that afternoon. Also made the [facility]  aware that this patient has not establish care with our office and that she will need to make a decision where she would like to be seen so that the proper care can be provide.  I have all [facility] and sent them more requested documents as the office stated they did not received any info on this patient. After the referral coordinator review her information they will call patient to sat up an appointment. Called patient and discuss this referral with her and I have given her the phone number to call and check on her referral.

## 2016-09-28 NOTE — Telephone Encounter (Signed)
Thank you for continuing to follow up on this!

## 2016-09-29 NOTE — Telephone Encounter (Signed)
I contacted the patient to be sure she heard [facility], the patient said she did, but couldn't remember what she said. I let her know the referral had been resubmitted and that she should hear from [facility] within the next week.

## 2016-09-29 NOTE — Telephone Encounter (Signed)
I [facility]ic[facility] Dr Mont Dutton[facility]wu's office. She said she called [facility] and was told they never received the referral, so she resubmitted the referral along with labs and visit notes. She said the patient should hear from them within the next week, and was going to contact the patient to let her know.

## 2016-10-06 ENCOUNTER — Telehealth: Payer: Self-pay | Admitting: *Deleted

## 2016-10-06 ENCOUNTER — Telehealth: Payer: Self-pay | Admitting: Obstetrics and Gynecology

## 2016-10-06 NOTE — Telephone Encounter (Signed)
Pt left message stating that she is checking on her referral. She still has not received a call with appt information. After reviewing notes in pt's chart, I called her back and left a message. I stated that we have sent all the requested information to the [facility]. Our understanding is that the referral coordinator will review the information and then contact the pt. If she has questions, she will need to call the telephone number she was given by our staff member during the call last week.

## 2016-10-06 NOTE — Telephone Encounter (Signed)
Patient called to ask if we had heard anything regarding her referral. I explained that the referral was placed by Dr Mont [facility]Anyanwu's [facility] at [facility] and [facility] to [facility], and that she should follow-up with one of those offices. The patient asked if Dr Jean RosenthalJackson would prescribe something for pain. Patient is aware Dr Jean RosenthalJackson is out of the office until next Wednesday, 10/13/16.

## 2016-10-27 ENCOUNTER — Telehealth: Payer: Self-pay

## 2016-10-27 NOTE — Telephone Encounter (Signed)
Pt calling.  Wants rx for pain.  Called last week with no response.  (I don't see that she called)  [phone]

## 2016-10-28 NOTE — Telephone Encounter (Signed)
Pt states she is taking tylenol and  Ibuprophen q4h with no relief.  Adv she may be over medicating and that can make you feel like it's not helping.  Also, if in that much pain to be seen.  She said the ER wouldn't do anything for her and that's why she called you.  Adv I would pass the msg along that I was just the messenger.

## 2016-10-28 NOTE — Telephone Encounter (Signed)
I can only prescribe a non-narcotic for pain at this point. I am happy to send in prescription-strength NSAID.   Please advise patient.

## 2017-03-22 ENCOUNTER — Emergency Department (HOSPITAL_COMMUNITY)
Admission: EM | Admit: 2017-03-22 | Discharge: 2017-03-22 | Disposition: A | Discharge status: Home / Self Care | Payer: Self-pay | Attending: Emergency Medicine | Admitting: Emergency Medicine

## 2017-03-22 ENCOUNTER — Encounter (HOSPITAL_COMMUNITY): Payer: Self-pay | Admitting: *Deleted

## 2017-03-22 DIAGNOSIS — F419 Anxiety disorder, unspecified: Secondary | ICD-10-CM | POA: Insufficient documentation

## 2017-03-22 DIAGNOSIS — Z9049 Acquired absence of other specified parts of digestive tract: Secondary | ICD-10-CM | POA: Insufficient documentation

## 2017-03-22 DIAGNOSIS — R112 Nausea with vomiting, unspecified: Secondary | ICD-10-CM | POA: Insufficient documentation

## 2017-03-22 DIAGNOSIS — F17200 Nicotine dependence, unspecified, uncomplicated: Secondary | ICD-10-CM | POA: Insufficient documentation

## 2017-03-22 DIAGNOSIS — F329 Major depressive disorder, single episode, unspecified: Secondary | ICD-10-CM | POA: Insufficient documentation

## 2017-03-22 DIAGNOSIS — Z79899 Other long term (current) drug therapy: Secondary | ICD-10-CM | POA: Insufficient documentation

## 2017-03-22 DIAGNOSIS — R102 Pelvic and perineal pain: Secondary | ICD-10-CM | POA: Insufficient documentation

## 2017-03-22 MED ORDER — HYDROMORPHONE HCL 1 MG/ML IJ SOLN
1.0000 mg | Freq: Once | INTRAMUSCULAR | Status: AC
  Administered 2017-03-22: 1 mg via INTRAMUSCULAR
  Filled 2017-03-22: qty 1

## 2017-03-22 MED ORDER — ONDANSETRON 4 MG PO TBDP
8.0000 mg | ORAL_TABLET | Freq: Once | ORAL | Status: AC
  Administered 2017-03-22: 8 mg via ORAL
  Filled 2017-03-22: qty 2

## 2017-03-22 MED ORDER — OXYCODONE HCL 10 MG PO TABS: 10.0000 mg | ORAL_TABLET | Freq: Four times a day (QID) | ORAL | 0 refills | Status: AC | PRN

## 2017-03-22 MED ORDER — OXYCODONE-ACETAMINOPHEN 5-325 MG PO TABS
2.0000 | ORAL_TABLET | Freq: Once | ORAL | Status: AC
  Administered 2017-03-22: 2 via ORAL
  Filled 2017-03-22: qty 2

## 2017-03-22 NOTE — ED Triage Notes (Signed)
Pt [facility] and had [facility] vaginal cuff repair, then had sex Sunday nite, then started having severe pelvic and lower abdominal pain with bleeding.  Reports vomiting and sent here for a pelvic

## 2017-03-22 NOTE — ED Notes (Signed)
When staff approached patient to reassess vital signs patient cursed,  yelled and refused vital signs.

## 2017-03-22 NOTE — Discharge Instructions (Signed)
Titrate up your Cymbalta to  for 1 week and then  for 1 week. Continue Tizanidine, Gabapentin, and Flexeril. Avoid sexual intercourse until you can follow up with your OBGYN.  Take  ibuprofen ever 6 hours for pain. You may supplement this with Oxycodone as needed. This is a SHORT course of pain medication and is NOT part of your long term pain management plan. You may return for new or concerning symptoms.
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
## 2017-07-19 ENCOUNTER — Encounter (HOSPITAL_COMMUNITY): Payer: Self-pay | Admitting: Emergency Medicine

## 2017-07-19 ENCOUNTER — Other Ambulatory Visit: Payer: Self-pay

## 2017-07-19 ENCOUNTER — Emergency Department (HOSPITAL_COMMUNITY): Payer: 59

## 2017-07-19 ENCOUNTER — Emergency Department (HOSPITAL_COMMUNITY)
Admission: EM | Admit: 2017-07-19 | Discharge: 2017-07-19 | Disposition: A | Discharge status: Home / Self Care | Payer: 59 | Attending: Emergency Medicine | Admitting: Emergency Medicine

## 2017-07-19 DIAGNOSIS — R109 Unspecified abdominal pain: Secondary | ICD-10-CM | POA: Diagnosis present

## 2017-07-19 DIAGNOSIS — Z79899 Other long term (current) drug therapy: Secondary | ICD-10-CM | POA: Diagnosis not present

## 2017-07-19 DIAGNOSIS — F17200 Nicotine dependence, unspecified, uncomplicated: Secondary | ICD-10-CM | POA: Insufficient documentation

## 2017-07-19 LAB — URINALYSIS, ROUTINE W REFLEX MICROSCOPIC
BILIRUBIN URINE: NEGATIVE
GLUCOSE, UA: NEGATIVE mg/dL
HGB URINE DIPSTICK: NEGATIVE
KETONES UR: NEGATIVE mg/dL
LEUKOCYTES UA: NEGATIVE
Nitrite: NEGATIVE
PROTEIN: NEGATIVE mg/dL
Specific Gravity, Urine: 1.015 (ref 1.005–1.030)
pH: 6 (ref 5.0–8.0)

## 2017-07-19 LAB — BASIC METABOLIC PANEL
Anion gap: 7 (ref 5–15)
BUN: 6 mg/dL (ref 6–20)
CALCIUM: 9 mg/dL (ref 8.9–10.3)
CO2: 26 mmol/L (ref 22–32)
CREATININE: 0.61 mg/dL (ref 0.44–1.00)
Chloride: 105 mmol/L (ref 101–111)
GFR calc Af Amer: >60 mL/min (ref >60)
GLUCOSE: 86 mg/dL (ref 65–99)
Potassium: 3.5 mmol/L (ref 3.5–5.1)
Sodium: 138 mmol/L (ref 135–145)

## 2017-07-19 MED ORDER — LIDOCAINE 5 % EX PTCH: 1.0000 | MEDICATED_PATCH | CUTANEOUS | 0 refills | Status: AC

## 2017-07-19 MED ORDER — OXYCODONE-ACETAMINOPHEN 5-325 MG PO TABS
1.0000 | ORAL_TABLET | Freq: Once | ORAL | Status: AC
  Administered 2017-07-19: 1 via ORAL
  Filled 2017-07-19: qty 1

## 2017-07-19 MED ORDER — ONDANSETRON 4 MG PO TBDP
4.0000 mg | ORAL_TABLET | Freq: Once | ORAL | Status: AC
  Administered 2017-07-19: 4 mg via ORAL
  Filled 2017-07-19: qty 1

## 2017-07-19 NOTE — ED Triage Notes (Signed)
Pt. Stated, my left kidney has been hurting for the last week.Some nausea
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
## 2017-07-19 NOTE — ED Notes (Signed)
Patient transported to Ultrasound 

## 2017-07-21 ENCOUNTER — Emergency Department (HOSPITAL_COMMUNITY)
Admission: EM | Admit: 2017-07-21 | Discharge: 2017-07-21 | Disposition: A | Discharge status: Home / Self Care | Payer: 59 | Attending: Emergency Medicine | Admitting: Emergency Medicine

## 2017-07-21 ENCOUNTER — Other Ambulatory Visit: Payer: Self-pay

## 2017-07-21 ENCOUNTER — Encounter (HOSPITAL_COMMUNITY): Payer: Self-pay | Admitting: Emergency Medicine

## 2017-07-21 ENCOUNTER — Emergency Department (HOSPITAL_COMMUNITY): Payer: 59

## 2017-07-21 ENCOUNTER — Emergency Department (HOSPITAL_COMMUNITY)
Admission: EM | Admit: 2017-07-21 | Discharge: 2017-07-21 | Disposition: A | Discharge status: Home / Self Care | Payer: 59

## 2017-07-21 ENCOUNTER — Encounter (HOSPITAL_COMMUNITY): Payer: Self-pay

## 2017-07-21 DIAGNOSIS — R1031 Right lower quadrant pain: Secondary | ICD-10-CM | POA: Insufficient documentation

## 2017-07-21 DIAGNOSIS — Z5321 Procedure and treatment not carried out due to patient leaving prior to being seen by health care provider: Secondary | ICD-10-CM

## 2017-07-21 DIAGNOSIS — F17200 Nicotine dependence, unspecified, uncomplicated: Secondary | ICD-10-CM | POA: Insufficient documentation

## 2017-07-21 DIAGNOSIS — M546 Pain in thoracic spine: Secondary | ICD-10-CM | POA: Diagnosis not present

## 2017-07-21 DIAGNOSIS — R109 Unspecified abdominal pain: Secondary | ICD-10-CM | POA: Diagnosis present

## 2017-07-21 DIAGNOSIS — Z79899 Other long term (current) drug therapy: Secondary | ICD-10-CM | POA: Insufficient documentation

## 2017-07-21 LAB — URINALYSIS, ROUTINE W REFLEX MICROSCOPIC
BILIRUBIN URINE: NEGATIVE
Glucose, UA: NEGATIVE mg/dL
HGB URINE DIPSTICK: NEGATIVE
Ketones, ur: NEGATIVE mg/dL
Leukocytes, UA: NEGATIVE
Nitrite: NEGATIVE
PROTEIN: NEGATIVE mg/dL
Specific Gravity, Urine: 1.014 (ref 1.005–1.030)
pH: 6 (ref 5.0–8.0)

## 2017-07-21 LAB — BASIC METABOLIC PANEL
ANION GAP: 6 (ref 5–15)
BUN: 8 mg/dL (ref 6–20)
CALCIUM: 9.4 mg/dL (ref 8.9–10.3)
CO2: 26 mmol/L (ref 22–32)
Chloride: 106 mmol/L (ref 101–111)
Creatinine, Ser: 0.63 mg/dL (ref 0.44–1.00)
Glucose, Bld: 93 mg/dL (ref 65–99)
Potassium: 4.3 mmol/L (ref 3.5–5.1)
Sodium: 138 mmol/L (ref 135–145)

## 2017-07-21 LAB — CBC
HCT: 41 % (ref 36.0–46.0)
Hemoglobin: 13.6 g/dL (ref 12.0–15.0)
MCH: 29.1 pg (ref 26.0–34.0)
MCHC: 33.2 g/dL (ref 30.0–36.0)
MCV: 87.6 fL (ref 78.0–100.0)
PLATELETS: 221 10*3/uL (ref 150–400)
RBC: 4.68 MIL/uL (ref 3.87–5.11)
RDW: 12.8 % (ref 11.5–15.5)
WBC: 5.2 10*3/uL (ref 4.0–10.5)

## 2017-07-21 MED ORDER — ACETAMINOPHEN 325 MG PO TABS
650.0000 mg | ORAL_TABLET | Freq: Once | ORAL | Status: AC
  Administered 2017-07-21: 650 mg via ORAL
  Filled 2017-07-21: qty 2

## 2017-07-21 MED ORDER — CYCLOBENZAPRINE HCL 10 MG PO TABS: 10.0000 mg | ORAL_TABLET | Freq: Two times a day (BID) | ORAL | 0 refills | Status: AC | PRN

## 2017-07-21 NOTE — Discharge Instructions (Signed)
Please read the attached information.  Please follow-up with your primary care specialist as instructed.  Please use medication only as directed.  Please return immediately with any new or worsening signs or symptoms. °

## 2017-07-21 NOTE — ED Notes (Signed)
Per Daphine DeutscherMartin NT pt verbalized is leaving. Pt left. This Clinical research associatewriter unable to speak with pt before leaving.
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
## 2017-07-21 NOTE — ED Triage Notes (Signed)
Pt complaint of right flank pain with associated nausea worsening over past week; pt denies GU symptoms.

## 2017-07-21 NOTE — ED Notes (Signed)
PT states understanding of care given, follow up care, and medication prescribed. PT ambulated from ED to car with a steady gait. 

## 2017-07-21 NOTE — ED Triage Notes (Signed)
Per Pt, Pt is coming from home with complaints of left and right flank pain that is radiating down the right abdomen. Pt reports starting to have nausea, vomiting, and diarrhea with the Pain. [facility] and left before she was seen.

## 2017-07-21 NOTE — ED Notes (Signed)
Patient transported to CT 

## 2017-11-01 ENCOUNTER — Encounter (HOSPITAL_COMMUNITY): Payer: Self-pay

## 2017-11-01 ENCOUNTER — Emergency Department (HOSPITAL_COMMUNITY)
Admission: EM | Admit: 2017-11-01 | Discharge: 2017-11-02 | Disposition: A | Discharge status: Home / Self Care | Payer: Self-pay | Attending: Emergency Medicine | Admitting: Emergency Medicine

## 2017-11-01 ENCOUNTER — Other Ambulatory Visit: Payer: Self-pay

## 2017-11-01 DIAGNOSIS — F17200 Nicotine dependence, unspecified, uncomplicated: Secondary | ICD-10-CM | POA: Insufficient documentation

## 2017-11-01 DIAGNOSIS — K5900 Constipation, unspecified: Secondary | ICD-10-CM | POA: Insufficient documentation

## 2017-11-01 DIAGNOSIS — Z79899 Other long term (current) drug therapy: Secondary | ICD-10-CM | POA: Insufficient documentation

## 2017-11-01 LAB — CBC
HCT: 36.7 % (ref 36.0–46.0)
HEMOGLOBIN: 12.3 g/dL (ref 12.0–15.0)
MCH: 29.4 pg (ref 26.0–34.0)
MCHC: 33.5 g/dL (ref 30.0–36.0)
MCV: 87.6 fL (ref 78.0–100.0)
Platelets: 213 10*3/uL (ref 150–400)
RBC: 4.19 MIL/uL (ref 3.87–5.11)
RDW: 12.5 % (ref 11.5–15.5)
WBC: 7.3 10*3/uL (ref 4.0–10.5)

## 2017-11-01 MED ORDER — ONDANSETRON 4 MG PO TBDP
4.0000 mg | ORAL_TABLET | Freq: Once | ORAL | Status: AC | PRN
  Administered 2017-11-01: 4 mg via ORAL
  Filled 2017-11-01: qty 1

## 2017-11-01 NOTE — ED Triage Notes (Addendum)
Pt presents with 1 week h/o abdominal pain.  Pt reports no bowel movement since 4/10, reports using all OTC laxatives without relief.  Pt reports abdominal distension, nausea and vomiting.  Pt reports attempting to disimpact herself, reports feeling stool in her rectum.  Pt said she was hospitalized last week for mental issues and opioid addiction.

## 2017-11-02 ENCOUNTER — Emergency Department (HOSPITAL_COMMUNITY): Payer: Self-pay

## 2017-11-02 LAB — COMPREHENSIVE METABOLIC PANEL
ALBUMIN: 4 g/dL (ref 3.5–5.0)
ALT: 18 U/L (ref 14–54)
ANION GAP: 10 (ref 5–15)
AST: 20 U/L (ref 15–41)
Alkaline Phosphatase: 49 U/L (ref 38–126)
BUN: 8 mg/dL (ref 6–20)
CHLORIDE: 102 mmol/L (ref 101–111)
CO2: 25 mmol/L (ref 22–32)
Calcium: 9.1 mg/dL (ref 8.9–10.3)
Creatinine, Ser: 0.65 mg/dL (ref 0.44–1.00)
GFR calc non Af Amer: >60 mL/min (ref >60)
GLUCOSE: 97 mg/dL (ref 65–99)
Potassium: 3.7 mmol/L (ref 3.5–5.1)
SODIUM: 137 mmol/L (ref 135–145)
Total Bilirubin: 0.5 mg/dL (ref 0.3–1.2)
Total Protein: 7 g/dL (ref 6.5–8.1)

## 2017-11-02 LAB — URINALYSIS, ROUTINE W REFLEX MICROSCOPIC
BACTERIA UA: NONE SEEN
Bilirubin Urine: NEGATIVE
Glucose, UA: NEGATIVE mg/dL
KETONES UR: 5 mg/dL — AB
Leukocytes, UA: NEGATIVE
NITRITE: NEGATIVE
PROTEIN: NEGATIVE mg/dL
Specific Gravity, Urine: 1.026 (ref 1.005–1.030)
pH: 5 (ref 5.0–8.0)

## 2017-11-02 LAB — LIPASE, BLOOD: LIPASE: 25 U/L (ref 11–51)

## 2017-11-02 MED ORDER — ONDANSETRON HCL 4 MG/2ML IJ SOLN
4.0000 mg | Freq: Once | INTRAMUSCULAR | Status: AC
  Administered 2017-11-02: 4 mg via INTRAVENOUS
  Filled 2017-11-02: qty 2

## 2017-11-02 MED ORDER — SORBITOL 70 % SOLN
960.0000 mL | TOPICAL_OIL | Freq: Once | ORAL | Status: AC
  Administered 2017-11-02: 960 mL via RECTAL
  Filled 2017-11-02: qty 473

## 2017-11-02 MED ORDER — POLYETHYLENE GLYCOL 3350 17 G PO PACK: 17.0000 g | PACK | Freq: Every day | ORAL | 0 refills | Status: AC

## 2017-11-02 MED ORDER — LIDOCAINE HCL URETHRAL/MUCOSAL 2 % EX GEL
1.0000 "application " | Freq: Once | CUTANEOUS | Status: DC
  Filled 2017-11-02: qty 20

## 2017-11-02 MED ORDER — SODIUM CHLORIDE 0.9 % IV BOLUS
1000.0000 mL | Freq: Once | INTRAVENOUS | Status: AC
  Administered 2017-11-02: 1000 mL via INTRAVENOUS

## 2017-11-02 NOTE — ED Notes (Addendum)
Pt family came to desk asking how much longer, explained to family what was going on and that she had 4 people in front of her as long as nothing emergent comes in.Family stated that they was going to leave and check her out, told family that by the time they got somewhere that she would have probably be back into a room we have people going up stairs and being DC. Family stated okay and went back and sat down and talk with pt. Pt still cussing and mad at family about the wait.

## 2017-11-02 NOTE — ED Notes (Signed)
Patient was able to only take 1/2 of the enema , sat on bedside commode able to have bowel movement good results , however stool was very hard.

## 2017-11-02 NOTE — ED Notes (Signed)
Patient transported to X-ray 

## 2017-11-02 NOTE — ED Notes (Signed)
Pt came to NF stating "how much longer, I have been here 6 hours." This EMT told pt that there was 6 people ahead of her and we are doing everything we can to get her seen. Pt walked away and sat back in her chair made a phone call and told them to come pick her up.
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
## 2017-11-02 NOTE — ED Notes (Signed)
Pt came to desk stating her symptoms are worsening and she is now vomiting brown colored emesis.

## 2019-08-16 IMAGING — CT CT RENAL STONE PROTOCOL
2 of 4 series · 16 of 46 positions shown, 18 images · non-contrast
Comparison: 09/06/2016

CLINICAL DATA: Bilateral flank pain for 1 week.

EXAM:
CT ABDOMEN AND PELVIS WITHOUT CONTRAST
TECHNIQUE: Multidetector CT imaging of the abdomen and pelvis was performed
following the standard protocol without IV contrast.

[Series 3: stone study 5.0 i30f 2 · axial · 0.75mm/px · z∈[+806,+1241]mm · 13 of 95 slices shown, 15 images]
[im 4/95  soft-tissue]
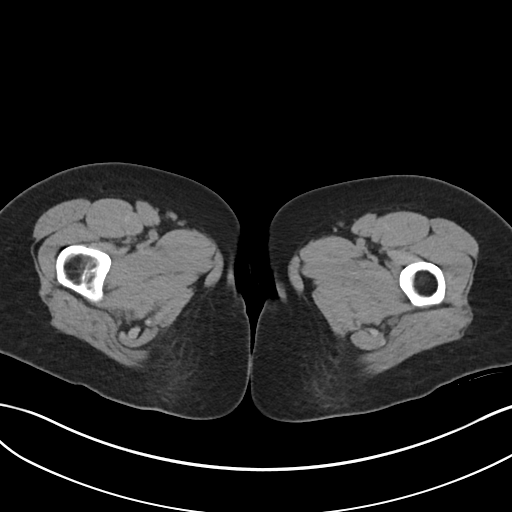
[im 4/95  bone]
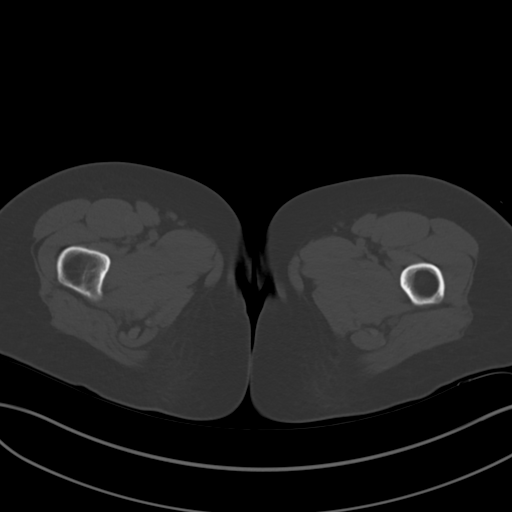
[im 11/95  soft-tissue]
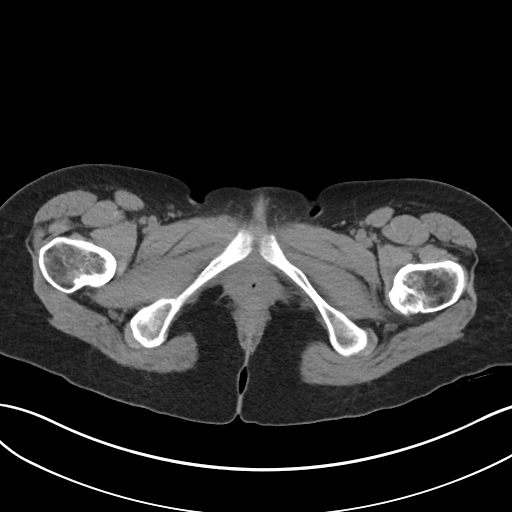
[im 19/95  soft-tissue]
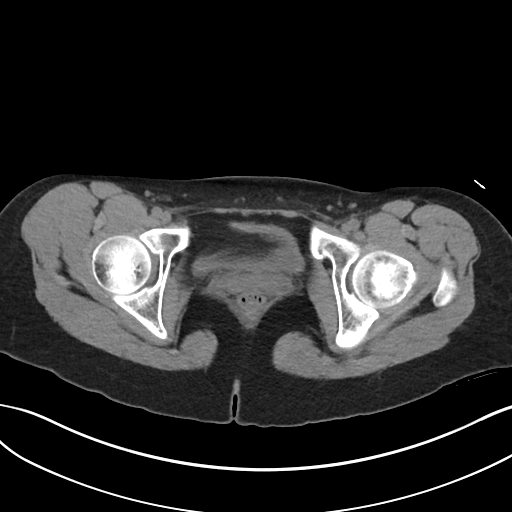
[im 26/95  soft-tissue]
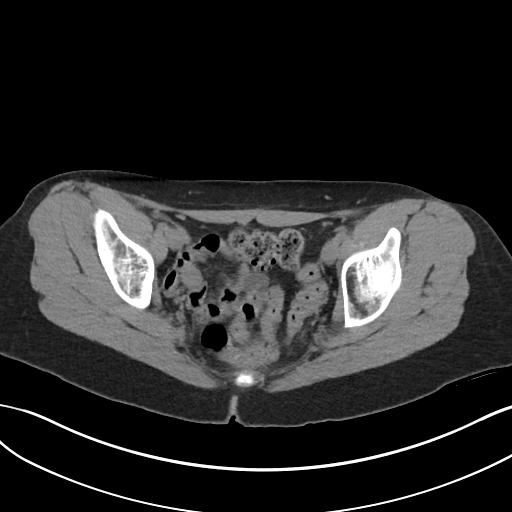
[im 33/95  soft-tissue]
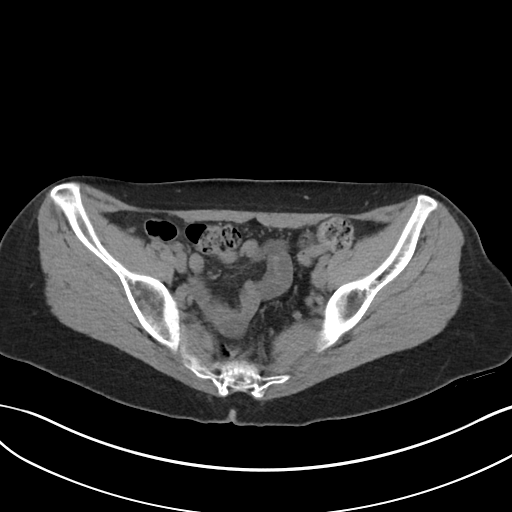
[im 40/95  soft-tissue]
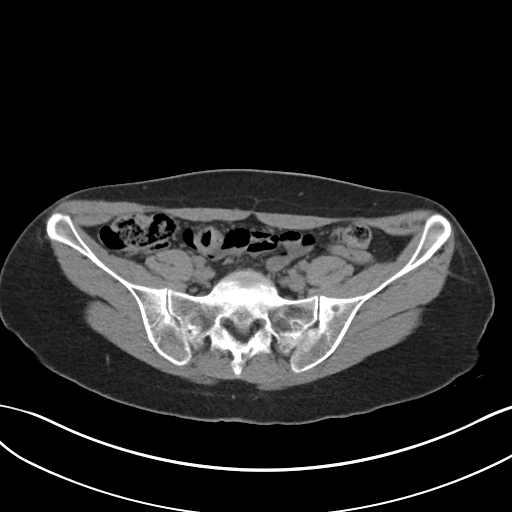
[im 48/95  soft-tissue]
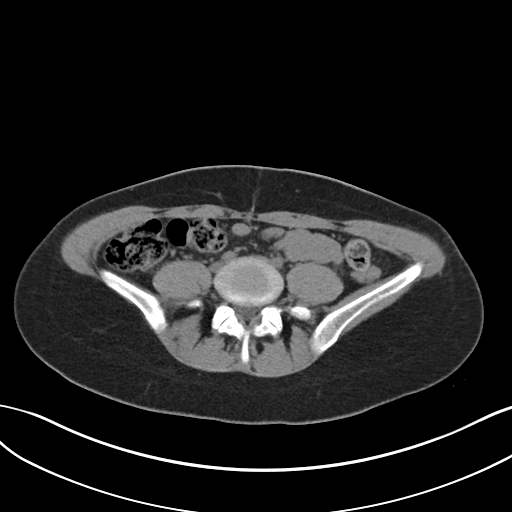
[im 55/95  soft-tissue]
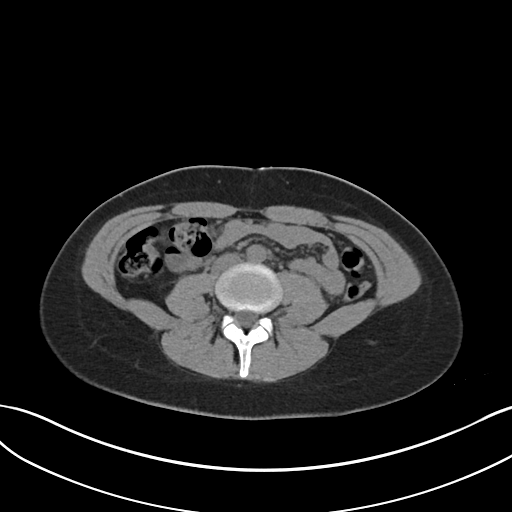
[im 62/95  soft-tissue]
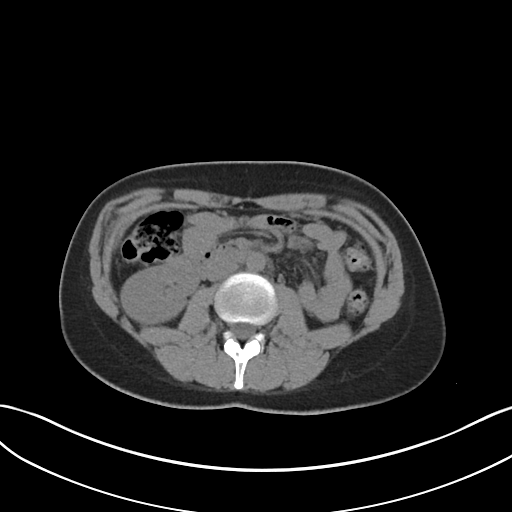
[im 62/95  bone]
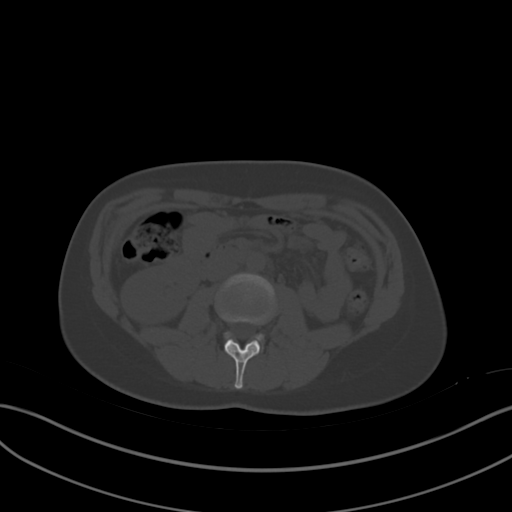
[im 69/95  soft-tissue]
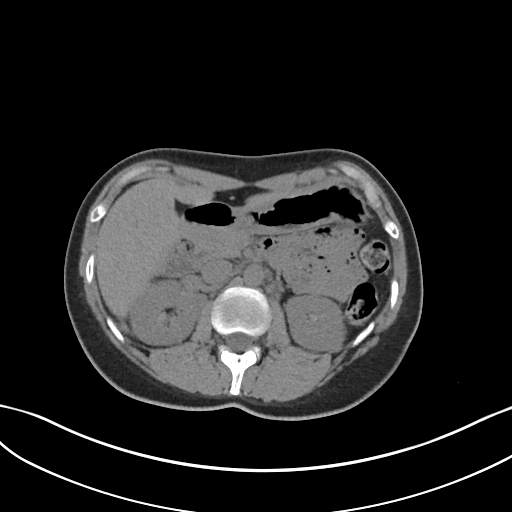
[im 76/95  soft-tissue]
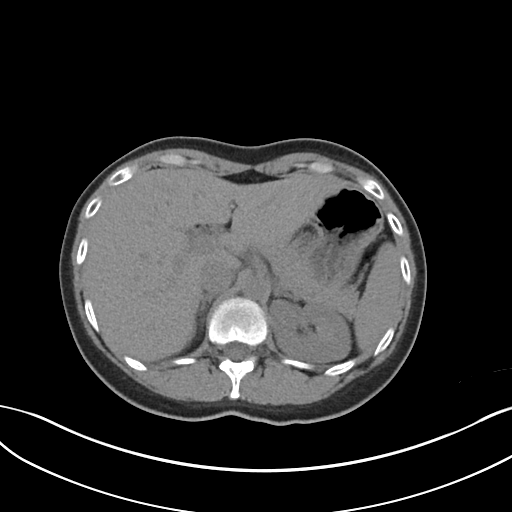
[im 84/95  soft-tissue]
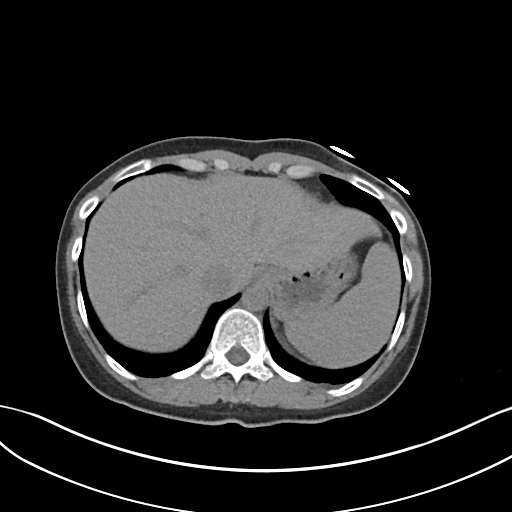
[im 91/95  soft-tissue]
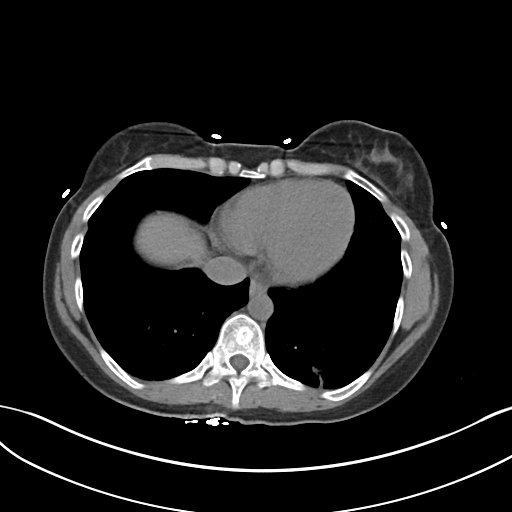

[Series 6: coronal soft tissue · coronal · 0.71mm/px · 3 of 70 slices shown]
[im 24/70  soft-tissue]
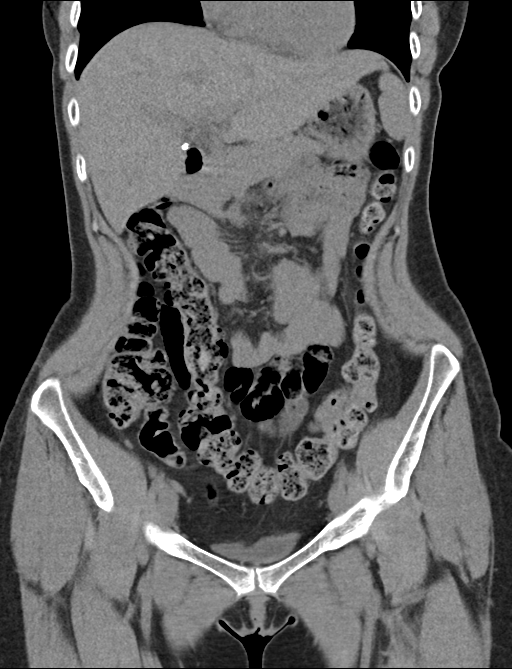
[im 31/70  soft-tissue]
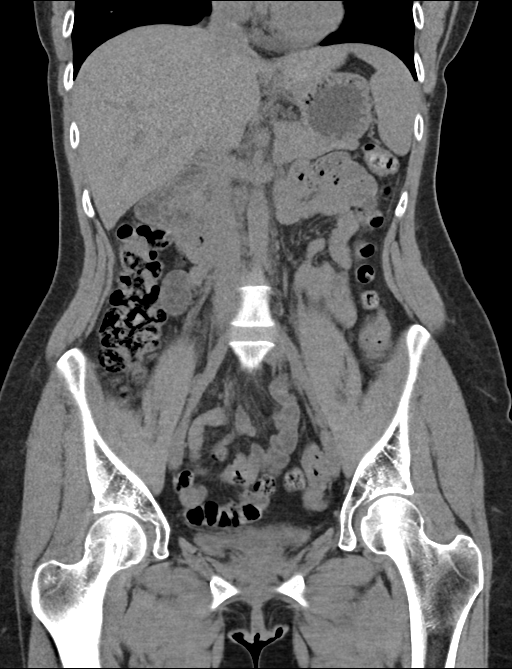
[im 39/70  soft-tissue]
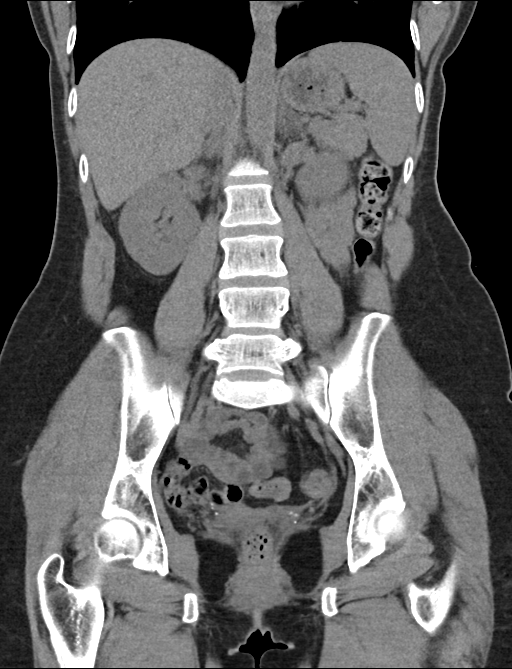

[16 of 46 positions shown; findings below may reference images not displayed]

FINDINGS: Lower chest: There is a left basilar density which is unchanged
since prior CT scans. This is probably post pneumonic scarring
change. No worrisome pulmonary lesions. The heart is normal in size.
No pericardial effusion.

Hepatobiliary: No focal hepatic lesions or intrahepatic biliary
dilatation. The gallbladder is surgically absent. No common bile
duct dilatation.

Pancreas: No mass, inflammation or ductal dilatation.

Spleen: Normal size.  No focal lesions.

Adrenals/Urinary Tract: The adrenal glands kidneys are unremarkable.
No renal, ureteral or bladder calculi mass.

Stomach/Bowel: The stomach, duodenum, small bowel and colon are
grossly normal without oral contrast. No inflammatory changes, mass
lesions or obstructive findings. The appendix is normal.

Vascular/Lymphatic: The aorta is normal in caliber. No
atheroscerlotic calcifications. No mesenteric of retroperitoneal
mass or adenopathy. Small scattered lymph nodes are noted.

Reproductive: Surgically absent

Other: No pelvic mass or adenopathy. No free pelvic fluid
collections. No inguinal mass or adenopathy. No abdominal wall
hernia or subcutaneous lesions.

Musculoskeletal: No significant bony findings.
IMPRESSION: 1. No acute abdominal/pelvic findings, mass lesions or
lymphadenopathy.
2. No renal, ureteral or bladder calculi.

## 2019-11-28 IMAGING — DX DG ABDOMEN ACUTE W/ 1V CHEST
3 series · 3 of 3 positions shown · non-contrast
Comparison: None.

CLINICAL DATA: Constipation for 3 weeks.

EXAM:
DG ABDOMEN ACUTE W/ 1V CHEST

[w chest pa]
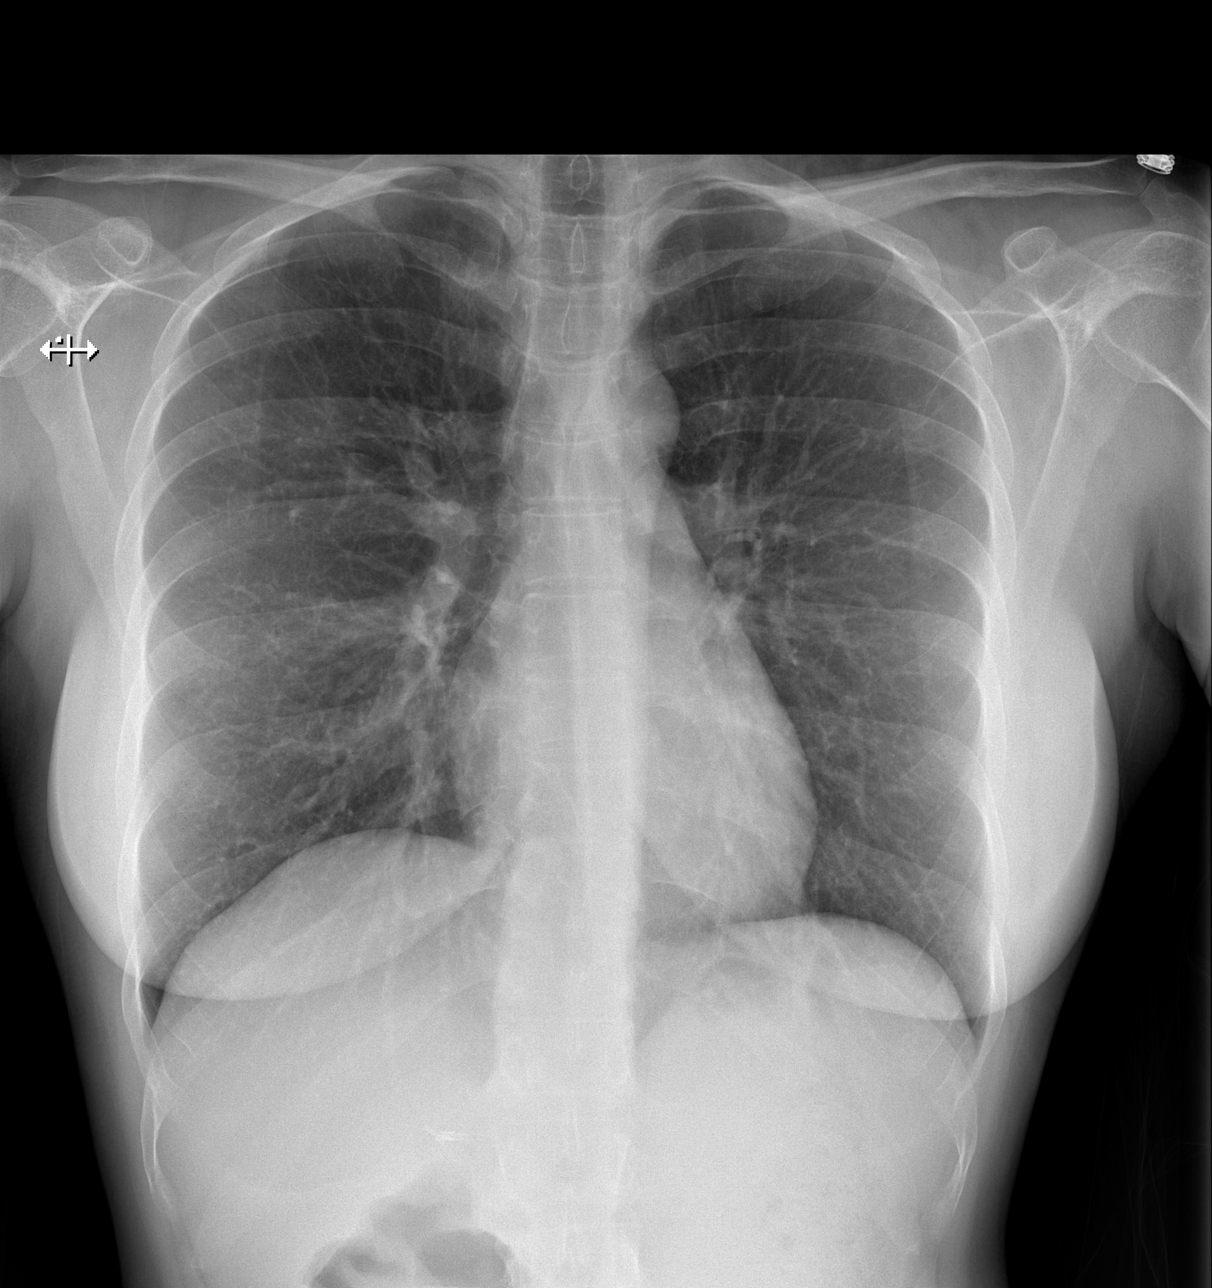

[w abdomen upright]
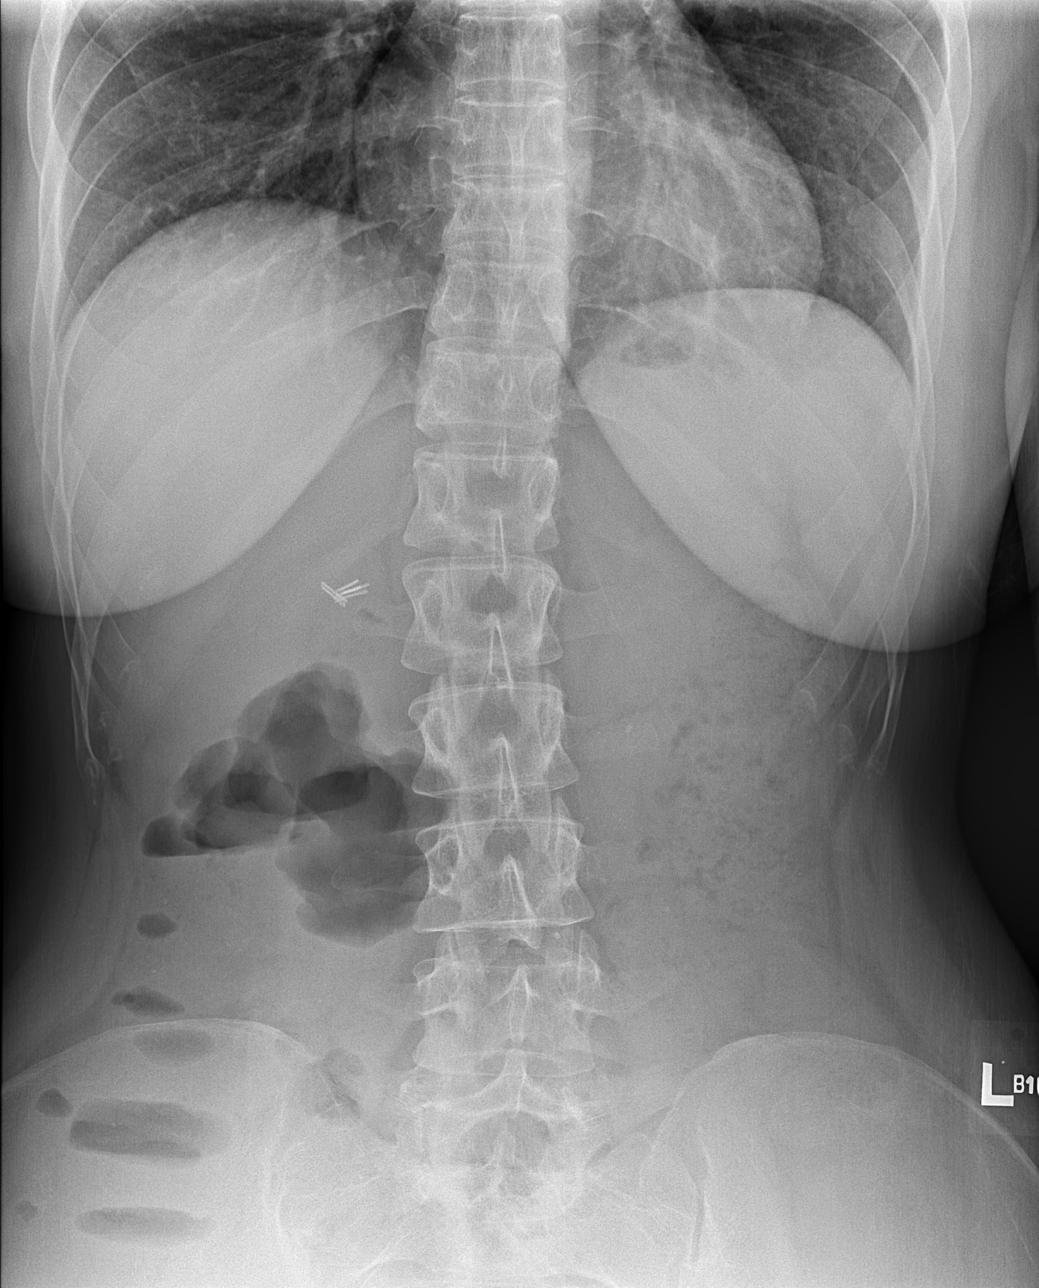

[t abdomen supine]
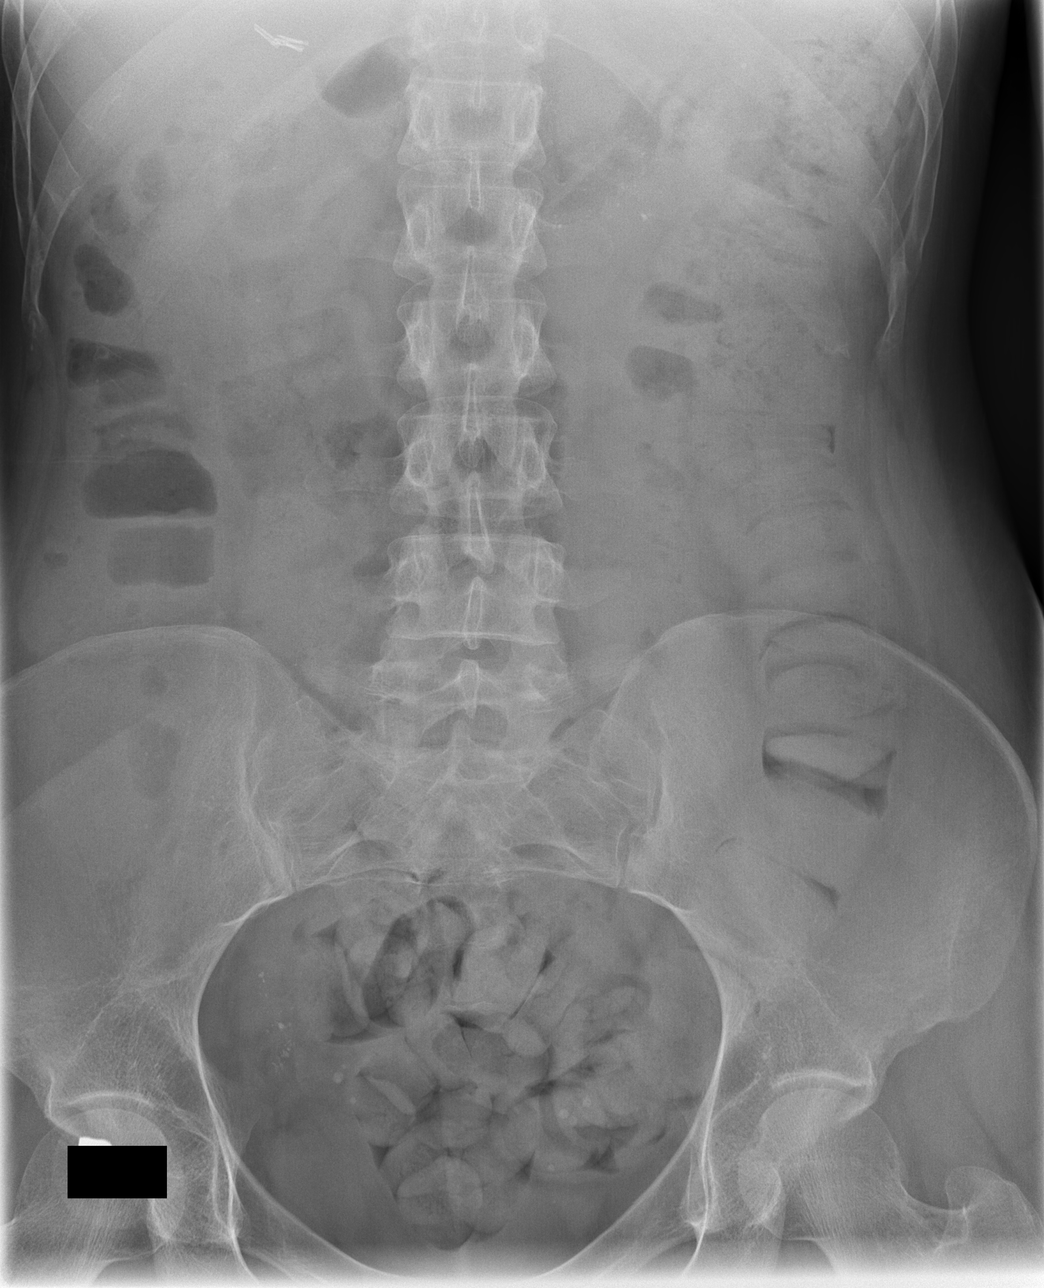

[3 of 3 positions shown; findings below may reference images not displayed]

FINDINGS: There is no evidence of dilated bowel loops or free intraperitoneal
air. No radiopaque calculi or other significant radiographic
abnormality is seen. Cholecystectomy clips. Heart size and
mediastinal contours are within normal limits. Both lungs are clear.
Moderate stool burden.
IMPRESSION: Negative abdominal radiographs. No acute cardiopulmonary disease.
Moderate stool burden, likely constipation.
